# Patient Record
Sex: Male | Born: 1970 | Hispanic: Yes | Marital: Single | Smoking: Current every day smoker
Health system: Southern US, Community
[De-identification: ages and names within clinical notes are randomized; demographics above are authoritative.]

## PROBLEM LIST (undated history)

## (undated) DIAGNOSIS — J45909 Unspecified asthma, uncomplicated: Secondary | ICD-10-CM

---

## 2014-10-24 ENCOUNTER — Emergency Department: Payer: Self-pay

## 2014-10-24 ENCOUNTER — Encounter: Payer: Self-pay | Admitting: Family Medicine

## 2014-10-24 ENCOUNTER — Emergency Department
Admission: EM | Admit: 2014-10-24 | Discharge: 2014-10-24 | Disposition: A | Discharge status: Home / Self Care | Payer: Self-pay | Attending: Emergency Medicine | Admitting: Emergency Medicine

## 2014-10-24 DIAGNOSIS — Y998 Other external cause status: Secondary | ICD-10-CM | POA: Insufficient documentation

## 2014-10-24 DIAGNOSIS — Y9389 Activity, other specified: Secondary | ICD-10-CM | POA: Insufficient documentation

## 2014-10-24 DIAGNOSIS — Y9289 Other specified places as the place of occurrence of the external cause: Secondary | ICD-10-CM | POA: Insufficient documentation

## 2014-10-24 DIAGNOSIS — J4541 Moderate persistent asthma with (acute) exacerbation: Secondary | ICD-10-CM | POA: Insufficient documentation

## 2014-10-24 DIAGNOSIS — S86911A Strain of unspecified muscle(s) and tendon(s) at lower leg level, right leg, initial encounter: Secondary | ICD-10-CM | POA: Insufficient documentation

## 2014-10-24 DIAGNOSIS — S86912A Strain of unspecified muscle(s) and tendon(s) at lower leg level, left leg, initial encounter: Secondary | ICD-10-CM

## 2014-10-24 DIAGNOSIS — X58XXXA Exposure to other specified factors, initial encounter: Secondary | ICD-10-CM | POA: Insufficient documentation

## 2014-10-24 HISTORY — DX: Unspecified asthma, uncomplicated: J45.909

## 2014-10-24 MED ORDER — PREDNISONE 20 MG PO TABS
60.0000 mg | ORAL_TABLET | Freq: Once | ORAL | Status: AC
  Administered 2014-10-24: 60 mg via ORAL

## 2014-10-24 MED ORDER — PREDNISONE 10 MG PO TABS: 50.0000 mg | ORAL_TABLET | Freq: Every day | ORAL | Status: DC

## 2014-10-24 MED ORDER — AZITHROMYCIN 250 MG PO TABS: ORAL_TABLET | ORAL | Status: DC

## 2014-10-24 MED ORDER — NAPROXEN 500 MG PO TABS: 500.0000 mg | ORAL_TABLET | Freq: Two times a day (BID) | ORAL | Status: AC

## 2014-10-24 MED ORDER — AZITHROMYCIN 250 MG PO TABS
500.0000 mg | ORAL_TABLET | Freq: Once | ORAL | Status: AC
  Administered 2014-10-24: 500 mg via ORAL

## 2014-10-24 MED ORDER — IPRATROPIUM-ALBUTEROL 0.5-2.5 (3) MG/3ML IN SOLN
RESPIRATORY_TRACT | Status: AC
  Administered 2014-10-24: 3 mL via RESPIRATORY_TRACT
  Filled 2014-10-24: qty 3

## 2014-10-24 MED ORDER — ALBUTEROL SULFATE (2.5 MG/3ML) 0.083% IN NEBU
2.5000 mg | INHALATION_SOLUTION | Freq: Once | RESPIRATORY_TRACT | Status: AC
Start: 2014-10-24 — End: 2014-10-24
  Administered 2014-10-24: 2.5 mg via RESPIRATORY_TRACT

## 2014-10-24 MED ORDER — ALBUTEROL SULFATE HFA 108 (90 BASE) MCG/ACT IN AERS: 2.0000 | INHALATION_SPRAY | Freq: Four times a day (QID) | RESPIRATORY_TRACT | Status: DC | PRN

## 2014-10-24 MED ORDER — IPRATROPIUM-ALBUTEROL 0.5-2.5 (3) MG/3ML IN SOLN
3.0000 mL | Freq: Once | RESPIRATORY_TRACT | Status: AC
  Administered 2014-10-24: 3 mL via RESPIRATORY_TRACT

## 2014-10-24 MED ORDER — IBUPROFEN 800 MG PO TABS
ORAL_TABLET | ORAL | Status: AC
  Administered 2014-10-24: 800 mg via ORAL
  Filled 2014-10-24: qty 1

## 2014-10-24 MED ORDER — IBUPROFEN 800 MG PO TABS
800.0000 mg | ORAL_TABLET | Freq: Once | ORAL | Status: AC
  Administered 2014-10-24: 800 mg via ORAL

## 2014-10-24 MED ORDER — AZITHROMYCIN 250 MG PO TABS
ORAL_TABLET | ORAL | Status: AC
  Administered 2014-10-24: 500 mg via ORAL
  Filled 2014-10-24: qty 2

## 2014-10-24 MED ORDER — ALBUTEROL SULFATE (2.5 MG/3ML) 0.083% IN NEBU
INHALATION_SOLUTION | RESPIRATORY_TRACT | Status: AC
  Administered 2014-10-24: 2.5 mg via RESPIRATORY_TRACT
  Filled 2014-10-24: qty 3

## 2014-10-24 MED ORDER — PREDNISONE 20 MG PO TABS
ORAL_TABLET | ORAL | Status: AC
  Administered 2014-10-24: 60 mg via ORAL
  Filled 2014-10-24: qty 3

## 2014-10-24 NOTE — ED Notes (Signed)
Discharge instructions reviewed and explained process for getting free care and assistance with medications. Interpreter present.

## 2014-10-24 NOTE — ED Notes (Signed)
Patient reports on his way into the ed to have his right knee checked out because it hurts.  Pt with obvious respiratory distress noted.  Lungs with wheezes bilaterally.

## 2014-10-24 NOTE — Discharge Instructions (Signed)
Asma °(Asthma) °El asma es una enfermedad de los pulmones en la que las vías respiratorias se estrechan y obstruyen. El asma puede causar dificultad para respirar. El asma no puede curarse, pero los medicamentos y los cambios en el estilo de vida pueden ayudar a controlarla. Los siguientes factores pueden iniciar (desencadenar) el asma: °· Escamas de la piel de los animales (caspa). °· Polvo. °· Cucarachas. °· Polen. °· Moho. °· Humo. °· Productos de limpieza. °· Aerosoles para el cabello. °· Vapores de pintura u olores fuertes. °· Aire frío, cambios climáticos y vientos. °· Llanto o risa intensos. °· Estrés. °· Ciertos medicamentos o drogas. °· Alimentos, como frutas secas, papas fritas y vinos espumantes. °· Infecciones o afecciones (resfríos, gripe). °· Haga actividad física. °· Ciertas enfermedades o afecciones. °· Ejercicio o actividades extenuantes. °CUIDADOS EN EL HOGAR  °· Tome los medicamentos como le indicó el médico. °· Use un medidor de flujo espiratorio máximo como le indicó su médico. El medidor de flujo espiratorio máximo es una herramienta que mide el funcionamiento de los pulmones. °· Anote y lleve un registro de los valores del medidor de flujo espiratorio máximo. °· Conozca el plan de acción para el asma y úselo. El plan de acción para el asma es una planificación por escrito para el control y tratamiento de sus crisis asmáticas. °· Para prevenir las crisis asmáticas: °¨ No fume. Aléjese del humo de otros fumadores. °¨ Cambie el filtro de la calefacción y del aire acondicionado con frecuencia. °¨ Limite el uso de hogares o estufas a leña. °¨ Elimine las plagas (como cucarachas, ratones) y sus excrementos. °¨ Elimine las plantas si observa moho en ellas. °¨ Limpie los pisos. Elimine el polvo regularmente. Use productos de limpieza que sean inoloros. °¨ Pídale a alguien que pase la aspiradora cuando usted no se encuentre en casa. Utilice una aspiradora con filtros HEPA, siempre que le sea  posible. °¨ Reemplace las alfombras por pisos de madera, baldosas o vinilo. Las alfombras pueden retener las escamas de la piel de los animales y el polvo. °¨ Use almohadas, mantas y cubre colchones antialérgicos. °¨ Lave las sábanas y las mantas todas las semanas con agua caliente y séquelas con aire caliente. °¨ Use mantas de poliéster o algodón. °¨ Limpie baños y cocinas con lavandina. Si fuera posible, pídale a alguien que vuelva a pintar las paredes de estas habitaciones con una pintura resistente a los hongos. Aléjese de las habitaciones que se están limpiando y pintando. °¨ Lávese las manos con frecuencia. °SOLICITE AYUDA SI: °· Hace un silbido al respirar (sibilancias), tiene falta de aire o tiene tos incluso después de tomar los medicamentos para prevenir crisis. °· El moco coloreado que elimina (esputo) es más denso de lo normal. °· El moco coloreado que elimina cambia de trasparente o blanco a amarillo, verde, gris o sanguinolento. °· Tiene problemas causados por el medicamento que toma, por ejemplo: °¨ Una erupción. °¨ Picazón. °¨ Hinchazón. °¨ Problemas respiratorios. °· Necesita un medicamento de alivio más de 2 a 3 veces por semana. °· El flujo espiratorio máximo aún está en 50 a 79 % del mejor valor personal después de seguir el plan de acción durante 1 hora. °· Tiene fiebre. °SOLICITE AYUDA DE INMEDIATO SI:  °· Parece empeorar y no responde al medicamento durante una crisis asmática. °· Le falta el aire, incluso en reposo. °· Le falta el aire incluso cuando hace muy poca actividad física. °· Tiene dificultad para comer, beber o hablar. °· Siente dolor en el   pecho.  La frecuencia cardaca est acelerada.  Sus labios o uas comienzan a Environmental education officer.  Usted se siente mareado, dbil o se desmaya.  Su flujo mximo es Garment/textile technologist del 50% del Pharmacist, hospital personal. ASEGRESE DE QUE:   Comprende estas instrucciones.  Controlar su afeccin.  Recibir ayuda de inmediato si no mejora o si  empeora. Document Released: 07/06/2008 Document Revised: 08/24/2013 [facility] Patient Information 2015 [employer]. This information is not intended to replace advice given to you by your health care provider. Make sure you discuss any questions you have with your health care provider.

## 2014-10-24 NOTE — ED Provider Notes (Signed)
[facility] Emergency Department Provider Note  ____________________________________________  Time seen: Approximately 9:18 PM  I have reviewed the triage vital signs and the nursing notes.   HISTORY  Chief Complaint Asthma and Knee Pain    HPI Derrick Williamson is a 44 y.o. male who presents to the emergency department for sudden onset right knee pain. He denies any known injury. He states that he awoke this morning with the pain. During triage, the RN noted that he was having some difficulty breathing. He states that he has asthma and it has gotten worse over the past few days, but that is not the reason he is here. He states that he doesn't have any insurance and has not been doing any good to get asthma medicine because he can't afford it.   Past Medical History  Diagnosis Date  . Asthma     There are no active problems to display for this patient.   No past surgical history on file.  Current Outpatient Rx  Name  Route  Sig  Dispense  Refill  . albuterol (PROVENTIL HFA;VENTOLIN HFA) 108 (90 BASE) MCG/ACT inhaler   Inhalation   Inhale 2 puffs into the lungs every 6 (six) hours as needed for wheezing or shortness of breath.   1 Inhaler   2   . naproxen (NAPROSYN) 500 MG tablet   Oral   Take 1 tablet (500 mg total) by mouth 2 (two) times daily with a meal.   60 tablet   0   . predniSONE (DELTASONE) 10 MG tablet   Oral   Take 5 tablets (50 mg total) by mouth daily.   25 tablet   0     Allergies Review of patient's allergies indicates no known allergies.  No family history on file.  Social History History  Substance Use Topics  . Smoking status: Not on file  . Smokeless tobacco: Not on file  . Alcohol Use: Not on file    Review of Systems Constitutional: No fever/chills Eyes: No visual changes. ENT: No sore throat. Cardiovascular: Denies chest pain. Respiratory: Positive for shortness of breath. Gastrointestinal: No abdominal  pain.  No nausea, no vomiting.  No diarrhea.  No constipation. Genitourinary: Negative for dysuria. Musculoskeletal: Positive for right knee pain Skin: Negative for rash. Neurological: Negative for headaches, focal weakness or numbness.  10-point ROS otherwise negative.  ____________________________________________   PHYSICAL EXAM:  VITAL SIGNS: ED Triage Vitals  Enc Vitals Group     BP 10/24/14 1943 145/76 mmHg     Pulse Rate 10/24/14 1943 123     Resp 10/24/14 1943 32     Temp 10/24/14 1943 100.2 F (37.9 C)     Temp Source 10/24/14 1943 Oral     SpO2 10/24/14 1943 95 %     Weight 10/24/14 1943 128 lb (58.06 kg)     Height 10/24/14 1943  (1.702 m)     Head Cir --      Peak Flow --      Pain Score 10/24/14 1944 10     Pain Loc --      Pain Edu? --      Excl. in GC? --     Constitutional: Alert and oriented. Well appearing and in no acute distress. Eyes: Conjunctivae are normal. PERRL. EOMI. Head: Atraumatic. Nose: No congestion/rhinnorhea. Mouth/Throat: Mucous membranes are moist.  Oropharynx non-erythematous. Neck: No stridor.   Cardiovascular: Normal rate, regular rhythm. Grossly normal heart sounds.  Good peripheral  circulation. Respiratory: Normal respiratory effort.  No retractions. Expiratory wheezes in all fields. Gastrointestinal: Soft and nontender. No distention. No abdominal bruits. No CVA tenderness. Musculoskeletal: Right knee appears H Malik. There is no erythema or swelling. There is moderate tenderness in the medial and lateral joint line. Able to maintain straight leg raise without assistance. Neurologic:  Normal speech and language. No gross focal neurologic deficits are appreciated. Speech is normal. No gait instability. Skin:  Skin is warm, dry and intact. No rash noted. Psychiatric: Mood and affect are normal. Speech and behavior are normal.  ____________________________________________   LABS (all labs ordered are listed, but only abnormal  results are displayed)  Labs Reviewed - No data to display ____________________________________________  EKG   ____________________________________________  RADIOLOGY  No cardiopulmonary abnormalities. Small joint effusion with osseous densities above the patella. Images were viewed by me ____________________________________________   PROCEDURES  Procedure(s) performed: Knee immobilizer applied. NV intact post application.  Critical Care performed: No  ____________________________________________   INITIAL IMPRESSION / ASSESSMENT AND PLAN / ED COURSE  Pertinent labs & imaging results that were available during my care of the patient were reviewed by me and considered in my medical decision making (see chart for details).  Good air movement after a duo-neb and albuterol SVN. He continues to have moderate wheezing. He received Prednisone 60mg  tonight and will have a Rx for 4 additional days. He has also received Azithromycin 500mg  and will receive Rx.  He requests to be discharged.  [facility] as [facility]. He was strongly advised to fill the prescriptions and take them as prescribed. He was advised to return to the emergency department for any worsening of wheezing or feeling of shortness of breath. He was also advised to schedule an appointment with [facility] for management of his asthma and to further evaluate his knee pain.  ____________________________________________   FINAL CLINICAL IMPRESSION(S) / ED DIAGNOSES  Final diagnoses:  Acute asthma exacerbation, moderate persistent  Knee strain, left, initial encounter      Chinita PesterCari B Cartina Brousseau, FNP 10/24/14 2258  Darien Ramusavid W Kaminski, MD 10/25/14 Mikle [id number]

## 2014-12-01 ENCOUNTER — Emergency Department: Payer: Self-pay

## 2014-12-01 ENCOUNTER — Encounter: Payer: Self-pay | Admitting: *Deleted

## 2014-12-01 ENCOUNTER — Observation Stay
Admission: EM | Admit: 2014-12-01 | Discharge: 2014-12-04 | Disposition: A | Discharge status: Home / Self Care | Payer: Self-pay | Attending: Internal Medicine | Admitting: Internal Medicine

## 2014-12-01 DIAGNOSIS — R0602 Shortness of breath: Secondary | ICD-10-CM | POA: Insufficient documentation

## 2014-12-01 DIAGNOSIS — R200 Anesthesia of skin: Secondary | ICD-10-CM | POA: Insufficient documentation

## 2014-12-01 DIAGNOSIS — F17200 Nicotine dependence, unspecified, uncomplicated: Secondary | ICD-10-CM | POA: Insufficient documentation

## 2014-12-01 DIAGNOSIS — M4602 Spinal enthesopathy, cervical region: Secondary | ICD-10-CM | POA: Insufficient documentation

## 2014-12-01 DIAGNOSIS — M7989 Other specified soft tissue disorders: Secondary | ICD-10-CM | POA: Insufficient documentation

## 2014-12-01 DIAGNOSIS — F419 Anxiety disorder, unspecified: Secondary | ICD-10-CM | POA: Insufficient documentation

## 2014-12-01 DIAGNOSIS — J189 Pneumonia, unspecified organism: Secondary | ICD-10-CM

## 2014-12-01 DIAGNOSIS — R079 Chest pain, unspecified: Secondary | ICD-10-CM

## 2014-12-01 DIAGNOSIS — R531 Weakness: Secondary | ICD-10-CM

## 2014-12-01 DIAGNOSIS — J181 Lobar pneumonia, unspecified organism: Secondary | ICD-10-CM

## 2014-12-01 DIAGNOSIS — E0590 Thyrotoxicosis, unspecified without thyrotoxic crisis or storm: Secondary | ICD-10-CM

## 2014-12-01 DIAGNOSIS — J4520 Mild intermittent asthma, uncomplicated: Secondary | ICD-10-CM | POA: Insufficient documentation

## 2014-12-01 DIAGNOSIS — J441 Chronic obstructive pulmonary disease with (acute) exacerbation: Secondary | ICD-10-CM | POA: Insufficient documentation

## 2014-12-01 DIAGNOSIS — R05 Cough: Secondary | ICD-10-CM | POA: Insufficient documentation

## 2014-12-01 DIAGNOSIS — R9431 Abnormal electrocardiogram [ECG] [EKG]: Secondary | ICD-10-CM

## 2014-12-01 DIAGNOSIS — M25441 Effusion, right hand: Secondary | ICD-10-CM

## 2014-12-01 DIAGNOSIS — R202 Paresthesia of skin: Secondary | ICD-10-CM | POA: Insufficient documentation

## 2014-12-01 DIAGNOSIS — E871 Hypo-osmolality and hyponatremia: Secondary | ICD-10-CM | POA: Insufficient documentation

## 2014-12-01 DIAGNOSIS — J188 Other pneumonia, unspecified organism: Secondary | ICD-10-CM | POA: Insufficient documentation

## 2014-12-01 DIAGNOSIS — M47814 Spondylosis without myelopathy or radiculopathy, thoracic region: Secondary | ICD-10-CM | POA: Insufficient documentation

## 2014-12-01 DIAGNOSIS — R0789 Other chest pain: Principal | ICD-10-CM | POA: Insufficient documentation

## 2014-12-01 DIAGNOSIS — R000 Tachycardia, unspecified: Secondary | ICD-10-CM | POA: Insufficient documentation

## 2014-12-01 DIAGNOSIS — R634 Abnormal weight loss: Secondary | ICD-10-CM | POA: Insufficient documentation

## 2014-12-01 DIAGNOSIS — M542 Cervicalgia: Secondary | ICD-10-CM | POA: Insufficient documentation

## 2014-12-01 LAB — COMPREHENSIVE METABOLIC PANEL
ALBUMIN: 3.9 g/dL (ref 3.5–5.0)
ALT: 31 U/L (ref 17–63)
ANION GAP: 10 (ref 5–15)
AST: 25 U/L (ref 15–41)
Alkaline Phosphatase: 120 U/L (ref 38–126)
BILIRUBIN TOTAL: 0.6 mg/dL (ref 0.3–1.2)
BUN: 18 mg/dL (ref 6–20)
CO2: 26 mmol/L (ref 22–32)
Calcium: 9.1 mg/dL (ref 8.9–10.3)
Chloride: 94 mmol/L — ABNORMAL LOW (ref 101–111)
Creatinine, Ser: 0.59 mg/dL — ABNORMAL LOW (ref 0.61–1.24)
GFR calc Af Amer: >60 mL/min (ref >60)
GFR calc non Af Amer: >60 mL/min (ref >60)
Glucose, Bld: 132 mg/dL — ABNORMAL HIGH (ref 65–99)
POTASSIUM: 4.2 mmol/L (ref 3.5–5.1)
Sodium: 130 mmol/L — ABNORMAL LOW (ref 135–145)
Total Protein: 7.5 g/dL (ref 6.5–8.1)

## 2014-12-01 LAB — CBC WITH DIFFERENTIAL/PLATELET
BASOS ABS: 0 10*3/uL (ref 0–0.1)
BASOS PCT: 0 %
EOS PCT: 5 %
Eosinophils Absolute: 0.5 10*3/uL (ref 0–0.7)
HCT: 44.8 % (ref 40.0–52.0)
HEMOGLOBIN: 15 g/dL (ref 13.0–18.0)
LYMPHS PCT: 15 %
Lymphs Abs: 1.6 10*3/uL (ref 1.0–3.6)
MCH: 28.7 pg (ref 26.0–34.0)
MCHC: 33.4 g/dL (ref 32.0–36.0)
MCV: 86 fL (ref 80.0–100.0)
Monocytes Absolute: 1.3 10*3/uL — ABNORMAL HIGH (ref 0.2–1.0)
Monocytes Relative: 12 %
Neutro Abs: 7.2 10*3/uL — ABNORMAL HIGH (ref 1.4–6.5)
Neutrophils Relative %: 68 %
Platelets: 188 10*3/uL (ref 150–440)
RBC: 5.21 MIL/uL (ref 4.40–5.90)
RDW: 13.3 % (ref 11.5–14.5)
WBC: 10.7 10*3/uL — AB (ref 3.8–10.6)

## 2014-12-01 LAB — T4, FREE

## 2014-12-01 LAB — C-REACTIVE PROTEIN: CRP: 1.6 mg/dL — ABNORMAL HIGH (ref <1.0)

## 2014-12-01 LAB — TSH: TSH: 0.025 u[IU]/mL — AB (ref 0.350–4.500)

## 2014-12-01 LAB — TROPONIN I
Troponin I: <0.03 ng/mL (ref <0.031)
Troponin I: <0.03 ng/mL (ref <0.031)

## 2014-12-01 LAB — MAGNESIUM: Magnesium: 1.6 mg/dL — ABNORMAL LOW (ref 1.7–2.4)

## 2014-12-01 LAB — SEDIMENTATION RATE: Sed Rate: 9 mm/hr (ref 0–15)

## 2014-12-01 MED ORDER — SODIUM CHLORIDE 0.9 % IJ SOLN
3.0000 mL | Freq: Two times a day (BID) | INTRAMUSCULAR | Status: DC
  Administered 2014-12-02 – 2014-12-04 (×3): 3 mL via INTRAVENOUS

## 2014-12-01 MED ORDER — IPRATROPIUM-ALBUTEROL 0.5-2.5 (3) MG/3ML IN SOLN
3.0000 mL | RESPIRATORY_TRACT | Status: DC | PRN
  Administered 2014-12-02: 3 mL via RESPIRATORY_TRACT
  Filled 2014-12-01: qty 3

## 2014-12-01 MED ORDER — HYDROMORPHONE HCL 1 MG/ML IJ SOLN
1.0000 mg | INTRAMUSCULAR | Status: AC
  Administered 2014-12-01: 1 mg via INTRAVENOUS

## 2014-12-01 MED ORDER — ONDANSETRON HCL 4 MG/2ML IJ SOLN
4.0000 mg | INTRAMUSCULAR | Status: AC
  Administered 2014-12-01: 4 mg via INTRAVENOUS

## 2014-12-01 MED ORDER — ONDANSETRON HCL 4 MG PO TABS
4.0000 mg | ORAL_TABLET | Freq: Four times a day (QID) | ORAL | Status: DC | PRN
  Administered 2014-12-02 (×2): 4 mg via ORAL
  Filled 2014-12-01 (×2): qty 1

## 2014-12-01 MED ORDER — IOHEXOL 350 MG/ML SOLN
100.0000 mL | Freq: Once | INTRAVENOUS | Status: AC | PRN
  Administered 2014-12-01: 100 mL via INTRAVENOUS

## 2014-12-01 MED ORDER — SODIUM CHLORIDE 0.9 % IV SOLN
INTRAVENOUS | Status: DC
  Administered 2014-12-01: 100 mL/h via INTRAVENOUS
  Administered 2014-12-02 – 2014-12-03 (×4): via INTRAVENOUS

## 2014-12-01 MED ORDER — DEXTROSE 5 % IV SOLN
500.0000 mg | INTRAVENOUS | Status: DC
Start: 2014-12-02 — End: 2014-12-03
  Administered 2014-12-02: 500 mg via INTRAVENOUS
  Filled 2014-12-01 (×2): qty 500

## 2014-12-01 MED ORDER — HYDROMORPHONE HCL 1 MG/ML IJ SOLN
1.0000 mg | INTRAMUSCULAR | Status: AC
  Administered 2014-12-01: 1 mg via INTRAVENOUS
  Filled 2014-12-01: qty 1

## 2014-12-01 MED ORDER — ACETAMINOPHEN 650 MG RE SUPP: 650.0000 mg | Freq: Four times a day (QID) | RECTAL | Status: DC | PRN

## 2014-12-01 MED ORDER — ACETAMINOPHEN 325 MG PO TABS: 650.0000 mg | ORAL_TABLET | Freq: Four times a day (QID) | ORAL | Status: DC | PRN

## 2014-12-01 MED ORDER — DEXTROSE 5 % IV SOLN
500.0000 mg | Freq: Once | INTRAVENOUS | Status: AC
  Administered 2014-12-01: 500 mg via INTRAVENOUS
  Filled 2014-12-01 (×2): qty 500

## 2014-12-01 MED ORDER — HEPARIN SODIUM (PORCINE) 5000 UNIT/ML IJ SOLN
5000.0000 [IU] | Freq: Three times a day (TID) | INTRAMUSCULAR | Status: DC
  Administered 2014-12-01 – 2014-12-03 (×5): 5000 [IU] via SUBCUTANEOUS
  Filled 2014-12-01 (×6): qty 1

## 2014-12-01 MED ORDER — DEXTROSE 5 % IV SOLN
1.0000 g | INTRAVENOUS | Status: AC
  Administered 2014-12-01: 1 g via INTRAVENOUS
  Filled 2014-12-01 (×2): qty 10

## 2014-12-01 MED ORDER — ASPIRIN EC 81 MG PO TBEC
81.0000 mg | DELAYED_RELEASE_TABLET | Freq: Every day | ORAL | Status: DC
  Administered 2014-12-01 – 2014-12-04 (×4): 81 mg via ORAL
  Filled 2014-12-01 (×4): qty 1

## 2014-12-01 MED ORDER — DEXTROSE 5 % IV SOLN
1.0000 g | INTRAVENOUS | Status: DC
  Administered 2014-12-02 – 2014-12-03 (×2): 1 g via INTRAVENOUS
  Filled 2014-12-01 (×3): qty 10

## 2014-12-01 MED ORDER — MORPHINE SULFATE 2 MG/ML IJ SOLN
2.0000 mg | INTRAMUSCULAR | Status: DC | PRN
  Administered 2014-12-01 – 2014-12-02 (×5): 2 mg via INTRAVENOUS
  Filled 2014-12-01 (×5): qty 1

## 2014-12-01 MED ORDER — SODIUM CHLORIDE 0.9 % IV BOLUS (SEPSIS)
1000.0000 mL | INTRAVENOUS | Status: AC
  Administered 2014-12-01: 1000 mL via INTRAVENOUS

## 2014-12-01 MED ORDER — ONDANSETRON HCL 4 MG/2ML IJ SOLN: 4.0000 mg | Freq: Four times a day (QID) | INTRAMUSCULAR | Status: DC | PRN

## 2014-12-01 MED ORDER — KETOROLAC TROMETHAMINE 30 MG/ML IJ SOLN
30.0000 mg | Freq: Once | INTRAMUSCULAR | Status: AC
  Administered 2014-12-01: 30 mg via INTRAVENOUS
  Filled 2014-12-01: qty 1

## 2014-12-01 MED ORDER — OXYCODONE HCL 5 MG PO TABS
5.0000 mg | ORAL_TABLET | ORAL | Status: DC | PRN
  Administered 2014-12-02 (×2): 5 mg via ORAL
  Filled 2014-12-01 (×2): qty 1

## 2014-12-01 MED ORDER — ONDANSETRON HCL 4 MG/2ML IJ SOLN
INTRAMUSCULAR | Status: AC
  Administered 2014-12-01: 4 mg via INTRAVENOUS
  Filled 2014-12-01: qty 2

## 2014-12-01 MED ORDER — HYDROMORPHONE HCL 1 MG/ML IJ SOLN
INTRAMUSCULAR | Status: AC
  Administered 2014-12-01: 1 mg via INTRAVENOUS
  Filled 2014-12-01: qty 1

## 2014-12-01 NOTE — ED Provider Notes (Signed)
[facility] Emergency Department Provider Note  ____________________________________________  Time seen: Approximately 5:49 PM  I have reviewed the triage vital signs and the nursing notes.   HISTORY  Chief Complaint Clavicle Injury  The history is limited by the patient's agitation and somewhat limited by English being a second language  HPI Derrick Williamson is a 44 y.o. male with medical history of hyperthyroidism (untreated), asthma, and tobacco abuse who presents with acute onset of severe pain in his anterior chest radiating from the medial portion of his left clavicle.  The patient is extremely agitated, pacing back and forth, crying out in pain, and stating that he needs pain medicine.  This is limiting history.  He reports that it hurt a little bit when he awoke this morning, then he lied down and took a nap, and when he awoke he was in severe pain he denies any trauma.  He denies shortness of breath.  He states that the pain as going from that spot of the medial clavicle and radiating into the entire left side of his chest and through to his back.  He states that his heart hurts.  The pain is severe.  He denies nausea, vomiting, abdominal pain.  This is the extent of history that I can get from the patient due to his agitation, he refuses to cooperate anymore until he gets pain medicine.   Past Medical History  Diagnosis Date  . Asthma     There are no active problems to display for this patient.   History reviewed. No pertinent past surgical history.  Current Outpatient Rx  Name  Route  Sig  Dispense  Refill  . albuterol (PROVENTIL HFA;VENTOLIN HFA) 108 (90 BASE) MCG/ACT inhaler   Inhalation   Inhale 2 puffs into the lungs every 6 (six) hours as needed for wheezing or shortness of breath.   1 Inhaler   2   . azithromycin (ZITHROMAX Z-PAK) 250 MG tablet      Take 2 tablets (500 mg) on  Day 1,  followed by 1 tablet (250 mg) once daily on Days 2  through 5.   6 each   0   . naproxen (NAPROSYN) 500 MG tablet   Oral   Take 1 tablet (500 mg total) by mouth 2 (two) times daily with a meal.   60 tablet   0   . predniSONE (DELTASONE) 10 MG tablet   Oral   Take 5 tablets (50 mg total) by mouth daily.   25 tablet   0     Allergies Review of patient's allergies indicates no known allergies.  History reviewed. No pertinent family history.  Social History Social History  Substance Use Topics  . Smoking status: Current Every Day Smoker  . Smokeless tobacco: None  . Alcohol Use: None    Review of Systems Constitutional: No fever/chills Eyes: No visual changes. ENT: No sore throat. Cardiovascular: Left medial clavicular pain radiating throughout chest and into the back. Respiratory: Denies shortness of breath. Gastrointestinal: No abdominal pain.  No nausea, no vomiting.  No diarrhea.  No constipation. Genitourinary: Negative for dysuria. Musculoskeletal: Pain in back from the front of his chest Skin: Negative for rash. Neurological: Negative for headaches, focal weakness or numbness. Psychiatric:Extremely anxious 10-point ROS otherwise negative.  ____________________________________________   PHYSICAL EXAM:  ED Triage Vitals  Enc Vitals Group     BP 12/01/14 1817 149/94 mmHg     Pulse Rate 12/01/14 1817 110  Resp 12/01/14 1817 18     Temp --      Temp src --      SpO2 12/01/14 1817 96 %     Weight --      Height --      Head Cir --      Peak Flow --      Pain Score 12/01/14 1724 10     Pain Loc --      Pain Edu? --      Excl. in GC? --     Constitutional: Extremely thin male, Alert and oriented, but agitated with odd affect, pacing, crying out in pain, very anxious Eyes: Conjunctivae are normal. PERRL. EOMI. Head: Atraumatic. Nose: No congestion/rhinnorhea. Mouth/Throat: Mucous membranes are moist.  Oropharynx non-erythematous. Neck: No stridor.  The patient has a little bit of swelling on the  medial aspect of his clavicle with severe point tenderness to palpation.  The patient swats my hand away from him when I attempt to touch this area.  No goiter. Cardiovascular: Normal rate, regular rhythm. Grossly normal heart sounds.  Good peripheral circulation. Respiratory: Normal respiratory effort.  No retractions. Lungs CTAB. Gastrointestinal: Soft and nontender. No distention. No abdominal bruits. No CVA tenderness.  Very thin male Musculoskeletal: No lower extremity tenderness nor edema.  No joint effusions. Neurologic:  Normal speech and language. No gross focal neurologic deficits are appreciated.  Skin:  Skin is warm, dry and intact. No rash noted. Psychiatric: Anxious, somewhat bizarre behavior, minimally cooperative ____________________________________________   LABS (all labs ordered are listed, but only abnormal results are displayed)  Labs Reviewed  CBC WITH DIFFERENTIAL/PLATELET - Abnormal; Notable for the following:    WBC 10.7 (*)    Neutro Abs 7.2 (*)    Monocytes Absolute 1.3 (*)    All other components within normal limits  COMPREHENSIVE METABOLIC PANEL - Abnormal; Notable for the following:    Sodium 130 (*)    Chloride 94 (*)    Glucose, Bld 132 (*)    Creatinine, Ser 0.59 (*)    All other components within normal limits  T4, FREE - Abnormal; Notable for the following:    Free T4 >5.50 (*)    All other components within normal limits  TSH - Abnormal; Notable for the following:    TSH 0.025 (*)    All other components within normal limits  TROPONIN I  MAGNESIUM  SEDIMENTATION RATE  C-REACTIVE PROTEIN   ____________________________________________  EKG  ED ECG REPORT I, Taraoluwa Thakur, the attending physician, personally viewed and interpreted this ECG.   Date: 12/01/2014  EKG Time: 19:20  Rate: 103  Rhythm: sinus tachycardia  Axis: normal  Intervals:normal  ST&T Change: The patient has global ST segment elevation of approximately 1-2 mm with  slightly increased elevation in the septal/anterior leads consistent with early repolarization. ____________________________________________  RADIOLOGY  Dg Chest 2 View  12/01/2014   CLINICAL DATA:  Left-sided chest pain radiating into left upper extremity. Shortness of breath.  EXAM: CHEST  2 VIEW  COMPARISON:  October 24, 2014  FINDINGS: There is no edema or consolidation. Heart size and pulmonary vascularity are normal. No adenopathy. No pneumothorax. There is slight lower thoracic levoscoliosis.  IMPRESSION: No edema or consolidation.   Electronically Signed   By: Bretta Bang III M.D.   On: 12/01/2014 18:13   Ct Angio Chest Aorta W/cm &/or Wo/cm  12/01/2014   CLINICAL DATA:  Severe acute chest pain radiating to the back  EXAM:  CT ANGIOGRAPHY CHEST WITH CONTRAST  TECHNIQUE: Multidetector CT imaging of the chest was performed using the standard protocol during bolus administration of intravenous contrast. Multiplanar CT image reconstructions and MIPs were obtained to evaluate the vascular anatomy.  CONTRAST:  OMNIPAQUE IOHEXOL 350 MG/ML SOLN  COMPARISON:  None.  FINDINGS: There is adequate opacification of the pulmonary arteries. There is no pulmonary embolus. The main pulmonary artery, right main pulmonary artery and left main pulmonary arteries are normal in size. The heart size is normal. There is no pericardial effusion. The thoracic aorta is normal in caliber. There is no thoracic aortic dissection. There is no mediastinal hematoma.  There is right middle lobe hazy airspace disease most consistent with pneumonia.  There is no axillary, hilar, or mediastinal adenopathy.  There is no lytic or blastic osseous lesion.  The visualized portions of the upper abdomen are unremarkable.  Review of the MIP images confirms the above findings.  IMPRESSION: 1. No evidence of thoracic aortic dissection. 2. No evidence of pulmonary embolus. 3. Right middle lobe pneumonia.   Electronically Signed   By: Elige Ko   On: 12/01/2014 19:47    ____________________________________________   PROCEDURES  Procedure(s) performed: None  Critical Care performed: No ____________________________________________   INITIAL IMPRESSION / ASSESSMENT AND PLAN / ED COURSE  Pertinent labs & imaging results that were available during my care of the patient were reviewed by me and considered in my medical decision making (see chart for details).  Given the patient's agitation and pain, my first part is to establish a peripheral IV and I will address his pain with Dilaudid 1 mg to attempt to address his pain and calm him down.  We will send general blood work and, when he is more relaxed we will obtain a EKG.  My first imaging study will be a two-view chest.  At this time I do not believe he is likely to be suffering from a pneumothorax given no difficulty breathing, point tenderness on the clavicle, and no diminished breath sounds on the left.  I also doubt this represents ACS but I will check a troponin.  Given his history of untreated hyperthyroidism and is extremely thin body habitus, I will check a TSH and a free T4.  ----------------------------------------- 8:29 PM on 12/01/2014 -----------------------------------------  After milligram of Dilaudid, the patient is still pacing and describing how much he hurts.  He received another dose of Dilaudid 1 mg IV as well as 30 mg of Toradol IV, which should also help with possible pericarditis.  I obtained a CT angiogram of the chest to rule out aortic dissection given the description of the patient's pain.  He is also describing some numbness and his left arm, further concerning for the possibility of dissection.  However, the CT was unremarkable with no evidence of thoracic aortic dissection or pulmonary embolus.  Somewhat surprisingly, the patient appears to have a right middle lobe pneumonia.  No specific cause of his left sided radiating severe chest pain was  identified.  His EKG is abnormal but most consistent with pericarditis.  However, the patient's free T4 is unmeasurable high, he is having severe persistent chest pain in spite of multiple rounds of medication, he has an abnormal EKG, and a right middle lobe pneumonia.  I believe that further care in the hospital is appropriate for him for serial enzymes, pain control, and evaluation of his thyroid disease.  He is afebrile and there is no evidence of thyroid storm  at this time.  I will discuss the case with the hospitalist.  I am also treating as pneumonia with ceftriaxone and azithromycin (community-acquired pneumonia).  We drew blood cultures prior to antibiotics.  I also added on a sedimentation rate and C-reactive protein given the question of pericarditis and the less likely possibility of myocarditis.    ____________________________________________  FINAL CLINICAL IMPRESSION(S) / ED DIAGNOSES  Final diagnoses:  Left sided chest pain  Right middle lobe pneumonia  Hyperthyroidism  ST segment elevation      NEW MEDICATIONS STARTED DURING THIS VISIT:  New Prescriptions   No medications on file     Loleta Rose, MD 12/01/14 2138

## 2014-12-01 NOTE — ED Notes (Signed)
Patient transported to CT 

## 2014-12-01 NOTE — ED Notes (Signed)
Pt reports was sleeping and upon awakening began to have sharp pain to left clavicle. Pt in obvious discomfort. Movement makes it worse.

## 2014-12-01 NOTE — Progress Notes (Signed)
   12/01/14 2100  Clinical Encounter Type  Visited With Patient and family together  Visit Type Spiritual support  Spiritual Encounters  Spiritual Needs Grief support  Stress Factors  Patient Stress Factors Health changes  Family Stress Factors Health changes   Status: complaints of pain couldn't stay in bed, 44 yr male, spanish speaker Family: wife or girlfriend bedside, no children together, she said Visit Assessment: She said that she was Catholic but the patient is not affiliated. An offer for an interpreter and a priest was declined. Chaplain gave encouraging words before the family goes to [facility]. The lady is waiting for her sister to bring some food. She shared that she is a cancer survivor and has her hair back and the patient has Thyroid issues and Pneumonia, she says.  Pastoral Care: [phone] pager and available online by request

## 2014-12-01 NOTE — H&P (Signed)
[facility] - [place] at [facility]   PATIENT NAME: Derrick Williamson    MR#:  [id number]  DATE OF BIRTH:  Jul 03, 1970   DATE OF ADMISSION:  12/01/2014  PRIMARY CARE PHYSICIAN: No primary care provider on file.   REQUESTING/REFERRING PHYSICIAN: [place]  CHIEF COMPLAINT:   Chief Complaint  Patient presents with  . Clavicle Injury    HISTORY OF PRESENT ILLNESS:  Derrick Williamson  is a 44 y.o. male with a known history of intermittent asthma, untreated hypertension presenting with chest pain. He describes one day duration of chest pain. Left chest in location, radiation to the back, "pain" for quality, 10/10 intensity no real worsening or relieving factors. Denies any trauma or further injury. He does have an associated cough with some shortness of breath, also describes having subjective fever and chills. Denies any palpitations or further symptomatology at this time. Emergency room course noted abnormal EKG finding, abnormal thyroid function test, imaging suggestive of right lobe pneumonia.  PAST MEDICAL HISTORY:   Past Medical History  Diagnosis Date  . Asthma     PAST SURGICAL HISTORY:  History reviewed. No pertinent past surgical history.  SOCIAL HISTORY:   Social History  Substance Use Topics  . Smoking status: Current Every Day Smoker  . Smokeless tobacco: Not on file  . Alcohol Use: No    FAMILY HISTORY:  Denies history of cardiac disease DRUG ALLERGIES:  No Known Allergies  REVIEW OF SYSTEMS:  REVIEW OF SYSTEMS:  CONSTITUTIONAL: Positive fevers, chills, denies fatigue, weakness.  EYES: Denies blurred vision, double vision, or eye pain.  EARS, NOSE, THROAT: Denies tinnitus, ear pain, hearing loss.  RESPIRATORY: Positive cough, shortness of breath, denies wheezing  CARDIOVASCULAR: Positive chest pain, denies palpitations, edema.  GASTROINTESTINAL: Denies nausea, vomiting, diarrhea, abdominal pain.  GENITOURINARY: Denies dysuria,  hematuria.  ENDOCRINE: Denies nocturia or thyroid problems. HEMATOLOGIC AND LYMPHATIC: Denies easy bruising or bleeding.  SKIN: Denies rash or lesions.  MUSCULOSKELETAL: Denies pain in neck, back, shoulder, knees, hips, or further arthritic symptoms.  NEUROLOGIC: Denies paralysis, paresthesias.  PSYCHIATRIC: Denies anxiety or depressive symptoms. Otherwise full review of systems performed by me is negative.   MEDICATIONS AT HOME:   Prior to Admission medications   Medication Sig Start Date End Date Taking? Authorizing Provider  albuterol (PROVENTIL HFA;VENTOLIN HFA) 108 (90 BASE) MCG/ACT inhaler Inhale 2 puffs into the lungs every 6 (six) hours as needed for wheezing or shortness of breath. Patient not taking: Reported on 12/01/2014 10/24/14   Chinita Pester, FNP  azithromycin (ZITHROMAX Z-PAK) 250 MG tablet Take 2 tablets (500 mg) on  Day 1,  followed by 1 tablet (250 mg) once daily on Days 2 through 5. Patient not taking: Reported on 12/01/2014 10/24/14   Chinita Pester, FNP  naproxen (NAPROSYN) 500 MG tablet Take 1 tablet (500 mg total) by mouth 2 (two) times daily with a meal. Patient not taking: Reported on 12/01/2014 10/24/14 10/24/15  Chinita Pester, FNP  predniSONE (DELTASONE) 10 MG tablet Take 5 tablets (50 mg total) by mouth daily. Patient not taking: Reported on 12/01/2014 10/24/14   Chinita Pester, FNP      VITAL SIGNS:  Blood pressure 156/88, pulse 96, temperature 98.4 F (36.9 C), temperature source Oral, resp. rate 20, SpO2 98 %.  PHYSICAL EXAMINATION:  VITAL SIGNS: Filed Vitals:   12/01/14 2025  BP: 156/88  Pulse: 96  Temp: 98.4 F (36.9 C)  Resp: 20   GENERAL:44 y.o.male currently  in mild acute distress secondary to pain. Thin appearing HEAD: Normocephalic, atraumatic.  EYES: Pupils equal, round, reactive to light. Extraocular muscles intact. No scleral icterus.  MOUTH: Moist mucosal membrane. Dentition intact. No abscess noted.  EAR, NOSE, THROAT: Clear without  exudates. No external lesions.  NECK: Supple. No thyromegaly. No nodules. No JVD.  PULMONARY: Rhonchi over the right side, scant expiratory wheeze. No use of accessory muscles, Good respiratory effort. good air entry bilaterally CHEST: Tender to palpation over left chest.  CARDIOVASCULAR: S1 and S2. Regular rate and rhythm. No murmurs, rubs, or gallops. No edema. Pedal pulses 2+ bilaterally.  GASTROINTESTINAL: Soft, nontender, nondistended. No masses. Positive bowel sounds. No hepatosplenomegaly.  MUSCULOSKELETAL: No swelling, clubbing, or edema. Range of motion full in all extremities.  NEUROLOGIC: Cranial nerves II through XII are intact. No gross focal neurological deficits. Sensation intact. Reflexes intact. No tremors SKIN: No ulceration, lesions, rashes, or cyanosis. Skin warm and dry. Turgor intact.  PSYCHIATRIC: Mood, affect within normal limits. The patient is awake, alert and oriented x 3. Insight, judgment intact.    LABORATORY PANEL:   CBC  Recent Labs Lab 12/01/14 1759  WBC 10.7*  HGB 15.0  HCT 44.8  PLT 188   ------------------------------------------------------------------------------------------------------------------  Chemistries   Recent Labs Lab 12/01/14 1759 12/01/14 1803  NA 130*  --   K 4.2  --   CL 94*  --   CO2 26  --   GLUCOSE 132*  --   BUN 18  --   CREATININE 0.59*  --   CALCIUM 9.1  --   MG  --  1.6*  AST 25  --   ALT 31  --   ALKPHOS 120  --   BILITOT 0.6  --    ------------------------------------------------------------------------------------------------------------------  Cardiac Enzymes  Recent Labs Lab 12/01/14 1759  TROPONINI <0.03   ------------------------------------------------------------------------------------------------------------------  RADIOLOGY:  Dg Chest 2 View  12/01/2014   CLINICAL DATA:  Left-sided chest pain radiating into left upper extremity. Shortness of breath.  EXAM: CHEST  2 VIEW  COMPARISON:   October 24, 2014  FINDINGS: There is no edema or consolidation. Heart size and pulmonary vascularity are normal. No adenopathy. No pneumothorax. There is slight lower thoracic levoscoliosis.  IMPRESSION: No edema or consolidation.   Electronically Signed   By: Bretta Bang III M.D.   On: 12/01/2014 18:13   Ct Angio Chest Aorta W/cm &/or Wo/cm  12/01/2014   CLINICAL DATA:  Severe acute chest pain radiating to the back  EXAM: CT ANGIOGRAPHY CHEST WITH CONTRAST  TECHNIQUE: Multidetector CT imaging of the chest was performed using the standard protocol during bolus administration of intravenous contrast. Multiplanar CT image reconstructions and MIPs were obtained to evaluate the vascular anatomy.  CONTRAST:  OMNIPAQUE IOHEXOL 350 MG/ML SOLN  COMPARISON:  None.  FINDINGS: There is adequate opacification of the pulmonary arteries. There is no pulmonary embolus. The main pulmonary artery, right main pulmonary artery and left main pulmonary arteries are normal in size. The heart size is normal. There is no pericardial effusion. The thoracic aorta is normal in caliber. There is no thoracic aortic dissection. There is no mediastinal hematoma.  There is right middle lobe hazy airspace disease most consistent with pneumonia.  There is no axillary, hilar, or mediastinal adenopathy.  There is no lytic or blastic osseous lesion.  The visualized portions of the upper abdomen are unremarkable.  Review of the MIP images confirms the above findings.  IMPRESSION: 1. No evidence of thoracic  aortic dissection. 2. No evidence of pulmonary embolus. 3. Right middle lobe pneumonia.   Electronically Signed   By: Elige Ko   On: 12/01/2014 19:47    EKG:   Orders placed or performed during the hospital encounter of 12/01/14  . ED EKG  . ED EKG    IMPRESSION AND PLAN:   44 year old Hispanic gentleman history of intermittent asthma, untreated hyperthyroidism presented with chest pain.  1. Chest pain, left-sided: Place  on telemetry, trend cardiac enzymes, initiate aspirin, check a CRP-given EKG findings with inferior PR depression suspect this could be related to pericarditis however there are no friction rubs on exam. If CRP is markedly elevated will initiate treatment with NSAIDs for presumptive pericarditis  2. Community acquired pneumonia: Antibiotics coverage with ceftriaxone and azithromycin, oxygen as required, DuoNeb treatments as required, follow culture data 3. Hyperthyroidism, untreated: No evidence of hyperthyroid storm, afebrile, with normal vital signs including heart rate and blood pressure, no signs of peripheral edema, GI symptoms regardless consult endocrinology for further management. 4. Hyponatremia: IV fluid hydration and follow sodium level V. Venous thromboembolism prophylactic: Heparin subcutaneous    All the records are reviewed and case discussed with ED provider. Management plans discussed with the patient, family and they are in agreement.  CODE STATUS: Full  TOTAL TIME TAKING CARE OF THIS PATIENT: 45 minutes.    Dnaiel Voller,  Mardi Mainland.D on 12/01/2014 at 9:08 PM  Between 7am to 6pm - Pager - [phone]  After 6pm: House Pager: - [phone]  [facility]  Office  [phone]  CC: Primary care physician; No primary care provider on file.

## 2014-12-02 ENCOUNTER — Observation Stay: Payer: Self-pay

## 2014-12-02 LAB — TROPONIN I: Troponin I: 0.03 ng/mL (ref <0.031)

## 2014-12-02 LAB — CK: CK TOTAL: 79 U/L (ref 49–397)

## 2014-12-02 LAB — C-REACTIVE PROTEIN: CRP: 1.5 mg/dL — AB (ref <1.0)

## 2014-12-02 MED ORDER — IPRATROPIUM-ALBUTEROL 0.5-2.5 (3) MG/3ML IN SOLN
3.0000 mL | Freq: Four times a day (QID) | RESPIRATORY_TRACT | Status: DC
  Administered 2014-12-02 – 2014-12-03 (×6): 3 mL via RESPIRATORY_TRACT
  Filled 2014-12-02 (×8): qty 3

## 2014-12-02 MED ORDER — MORPHINE SULFATE 2 MG/ML IJ SOLN
1.0000 mg | Freq: Once | INTRAMUSCULAR | Status: AC
  Administered 2014-12-02: 1 mg via INTRAVENOUS
  Filled 2014-12-02: qty 1

## 2014-12-02 MED ORDER — GABAPENTIN 100 MG PO CAPS
100.0000 mg | ORAL_CAPSULE | Freq: Two times a day (BID) | ORAL | Status: DC
  Administered 2014-12-02 – 2014-12-04 (×5): 100 mg via ORAL
  Filled 2014-12-02 (×5): qty 1

## 2014-12-02 MED ORDER — NICOTINE 14 MG/24HR TD PT24
14.0000 mg | MEDICATED_PATCH | Freq: Every day | TRANSDERMAL | Status: DC
  Filled 2014-12-02 (×2): qty 1

## 2014-12-02 MED ORDER — KETOROLAC TROMETHAMINE 15 MG/ML IJ SOLN
15.0000 mg | Freq: Four times a day (QID) | INTRAMUSCULAR | Status: DC | PRN
  Administered 2014-12-02: 15 mg via INTRAVENOUS
  Filled 2014-12-02 (×2): qty 1

## 2014-12-02 MED ORDER — OXYCODONE-ACETAMINOPHEN 5-325 MG PO TABS
1.0000 | ORAL_TABLET | Freq: Four times a day (QID) | ORAL | Status: DC | PRN
  Administered 2014-12-02 – 2014-12-03 (×3): 1 via ORAL
  Filled 2014-12-02 (×3): qty 1

## 2014-12-02 MED ORDER — METHYLPREDNISOLONE SODIUM SUCC 125 MG IJ SOLR
125.0000 mg | Freq: Once | INTRAMUSCULAR | Status: AC
  Administered 2014-12-02: 125 mg via INTRAVENOUS
  Filled 2014-12-02: qty 2

## 2014-12-02 MED ORDER — FAMOTIDINE 20 MG PO TABS
20.0000 mg | ORAL_TABLET | Freq: Two times a day (BID) | ORAL | Status: DC
  Administered 2014-12-02 – 2014-12-04 (×5): 20 mg via ORAL
  Filled 2014-12-02 (×5): qty 1

## 2014-12-02 MED ORDER — METHYLPREDNISOLONE SODIUM SUCC 125 MG IJ SOLR
60.0000 mg | Freq: Three times a day (TID) | INTRAMUSCULAR | Status: DC
  Administered 2014-12-02 – 2014-12-03 (×3): 60 mg via INTRAVENOUS
  Filled 2014-12-02 (×3): qty 2

## 2014-12-02 MED ORDER — DOCUSATE SODIUM 100 MG PO CAPS
100.0000 mg | ORAL_CAPSULE | Freq: Two times a day (BID) | ORAL | Status: DC
  Administered 2014-12-02 – 2014-12-04 (×4): 100 mg via ORAL
  Filled 2014-12-02 (×5): qty 1

## 2014-12-02 MED ORDER — ALBUTEROL SULFATE (2.5 MG/3ML) 0.083% IN NEBU: 2.5000 mg | INHALATION_SOLUTION | RESPIRATORY_TRACT | Status: DC | PRN

## 2014-12-02 NOTE — Consult Note (Signed)
Left painful sternoclavicular pain, tender.  Appears to be anterior sternoclavicular subluxation.  Should not need treatment.  Could be septic joint, but now warmth of fluctuance.  Will follow while in hospital.

## 2014-12-02 NOTE — Progress Notes (Signed)
Initial Nutrition Assessment       INTERVENTION:  Meals and snacks: Cater to pt preferences Coordination of care: Discussed pt in pain with RN, Tacey Ruiz and aware.   NUTRITION DIAGNOSIS:   Unintentional weight loss related to  (unknown cause) as evidenced by percent weight loss.    GOAL:   Patient will meet greater than or equal to 90% of their needs    MONITOR:    (Energy intake)  REASON FOR ASSESSMENT:   Malnutrition Screening Tool    ASSESSMENT:      Pt admitted with chest pain, clavicle pain (ortho following), left upper extremity and lower extremity tingling, elevated blood pressure  Past Medical History  Diagnosis Date  . Asthma     Current Nutrition: eating 100% of meals during admission  Food/Nutrition-Related History: pt reports good po intake prior to admission, small decrease in intake for 4 days prior to admission but still eating   Medications: NS at 142ml/hr, colace, solumedrol  Electrolyte/Renal Profile and Glucose Profile:   Recent Labs Lab 12/01/14 1759 12/01/14 1803  NA 130*  --   K 4.2  --   CL 94*  --   CO2 26  --   BUN 18  --   CREATININE 0.59*  --   CALCIUM 9.1  --   MG  --  1.6*  GLUCOSE 132*  --    Protein Profile:   Recent Labs Lab 12/01/14 1759  ALBUMIN 3.9     Last BM: unknown   Nutrition-Focused Physical Exam Findings:  Unable to complete Nutrition-Focused physical exam at this time.  Pt complaining of pain during visit.   Weight Change: Pt reports 70 pound weight loss in the last year (35% weight loss in 1 year)    Diet Order:  Diet Heart Room service appropriate?: Yes; Fluid consistency:: Thin  Skin:   reviewed      Height:   Ht Readings from Last 1 Encounters:  12/02/14  (1.702 m)    Weight:   Wt Readings from Last 1 Encounters:  12/01/14 126 lb 8 oz (57.38 kg)     BMI:  Body mass index is 19.81 kg/(m^2).  Estimated Nutritional Needs:   Kcal:  BEE 1428 kcals (IF 1.0-1.2, AF  1.3) 1610-9604 kcals/d  Protein:  (1.0-1.2 g/kg) 57-68 g/d  Fluid:  (25-49ml/kg) 1425-1716ml/d  EDUCATION NEEDS:   No education needs identified at this time  MODERATE Care Level  Garison Genova B. Freida Busman, RD, LDN [phone] (pager)

## 2014-12-02 NOTE — Progress Notes (Signed)
Alert and oriented. Sinus rhythm on tele. IV fluids and antibiotics continued, positive pneumonia on chest CT. Patient has complained of 8/10-10/10 pain in his left clavicle area all day, given multiple pain medications with some relief. Patient has slept some this afternoon and is able to move his arm more now that the pain has decreased some. Orthopedics has been by and patient has been educated with a spanish document about his clavicle. Endocrinology and neurology will see the patient tomorrow. Will continue to monitor.

## 2014-12-02 NOTE — Progress Notes (Signed)
[facility] - [place] at [facility]   PATIENT NAME: Derrick Williamson    MR#:  [id number]  DATE OF BIRTH:  February 08, 1971  SUBJECTIVE:  CHIEF COMPLAINT:  Patient is reporting left upper extremity and lower extremity tingling and numbness associated with pain. He is worried about his elevated blood pressure. He is also reporting left clavicular pain with some swelling. Denies any trauma or injury. Admits smoking approximately half pack a day  REVIEW OF SYSTEMS:  CONSTITUTIONAL: No fever, fatigue or weakness.  EYES: No blurred or double vision.  EARS, NOSE, AND THROAT: No tinnitus or ear pain.  RESPIRATORY: No cough, shortness of breath, wheezing or hemoptysis.  CARDIOVASCULAR: No chest pain, orthopnea, edema.  GASTROINTESTINAL: No nausea, vomiting, diarrhea or abdominal pain.  GENITOURINARY: No dysuria, hematuria.  ENDOCRINE: No polyuria, nocturia,  HEMATOLOGY: No anemia, easy bruising or bleeding SKIN: No rash or lesion. MUSCULOSKELETAL: No joint pain or arthritis.  Reporting left clavicular pain NEUROLOGIC: Reporting tingling, numbness and weakness on left upper extremity and lower extremity. Denies any difficulty with swallowing PSYCHIATRY: No anxiety or depression.   DRUG ALLERGIES:  No Known Allergies  VITALS:  Blood pressure 135/66, pulse 105, temperature 99.5 F (37.5 C), temperature source Oral, resp. rate 24, height 5\' 7"  (1.702 m), weight 57.38 kg (126 lb 8 oz), SpO2 92 %.  PHYSICAL EXAMINATION:  GENERAL:  44 y.o.-year-old patient lying in the bed with no acute distress.  EYES: Pupils equal, round, reactive to light and accommodation. No scleral icterus. Extraocular muscles intact.  HEENT: Head atraumatic, normocephalic. Oropharynx and nasopharynx clear.  NECK:  Supple, no jugular venous distention. No thyroid enlargement, no tenderness.  LUNGS: Normal breath sounds bilaterally, no wheezing, rales,rhonchi or crepitation. No use of accessory muscles of  respiration.  CARDIOVASCULAR: S1, S2 normal. No murmurs, rubs, or gallops.  ABDOMEN: Soft, nontender, nondistended. Bowel sounds present. No organomegaly or mass.  EXTREMITIES: No pedal edema, cyanosis, or clubbing.  NEUROLOGIC: Cranial nerves II through XII are intact. Muscle strength 4/5 in left upper and lower extremity and 5 out of 5 in right extremities. Sensation intact. Gait not checked. Reflexes are 2+. No deviation of the angle of the mouth PSYCHIATRIC: The patient is alert and oriented x 3.  SKIN: No obvious rash, lesion, or ulcer.    LABORATORY PANEL:   CBC  Recent Labs Lab 12/01/14 1759  WBC 10.7*  HGB 15.0  HCT 44.8  PLT 188   ------------------------------------------------------------------------------------------------------------------  Chemistries   Recent Labs Lab 12/01/14 1759 12/01/14 1803  NA 130*  --   K 4.2  --   CL 94*  --   CO2 26  --   GLUCOSE 132*  --   BUN 18  --   CREATININE 0.59*  --   CALCIUM 9.1  --   MG  --  1.6*  AST 25  --   ALT 31  --   ALKPHOS 120  --   BILITOT 0.6  --    ------------------------------------------------------------------------------------------------------------------  Cardiac Enzymes  Recent Labs Lab 12/02/14 1043  TROPONINI 0.03   ------------------------------------------------------------------------------------------------------------------  RADIOLOGY:  Dg Chest 2 View  12/01/2014   CLINICAL DATA:  Left-sided chest pain radiating into left upper extremity. Shortness of breath.  EXAM: CHEST  2 VIEW  COMPARISON:  October 24, 2014  FINDINGS: There is no edema or consolidation. Heart size and pulmonary vascularity are normal. No adenopathy. No pneumothorax. There is slight lower thoracic levoscoliosis.  IMPRESSION: No edema or consolidation.  Electronically Signed   By: Bretta Bang III M.D.   On: 12/01/2014 18:13   Ct Angio Chest Aorta W/cm &/or Wo/cm  12/01/2014   CLINICAL DATA:  Severe acute chest  pain radiating to the back  EXAM: CT ANGIOGRAPHY CHEST WITH CONTRAST  TECHNIQUE: Multidetector CT imaging of the chest was performed using the standard protocol during bolus administration of intravenous contrast. Multiplanar CT image reconstructions and MIPs were obtained to evaluate the vascular anatomy.  CONTRAST:  OMNIPAQUE IOHEXOL 350 MG/ML SOLN  COMPARISON:  None.  FINDINGS: There is adequate opacification of the pulmonary arteries. There is no pulmonary embolus. The main pulmonary artery, right main pulmonary artery and left main pulmonary arteries are normal in size. The heart size is normal. There is no pericardial effusion. The thoracic aorta is normal in caliber. There is no thoracic aortic dissection. There is no mediastinal hematoma.  There is right middle lobe hazy airspace disease most consistent with pneumonia.  There is no axillary, hilar, or mediastinal adenopathy.  There is no lytic or blastic osseous lesion.  The visualized portions of the upper abdomen are unremarkable.  Review of the MIP images confirms the above findings.  IMPRESSION: 1. No evidence of thoracic aortic dissection. 2. No evidence of pulmonary embolus. 3. Right middle lobe pneumonia.   Electronically Signed   By: Elige Ko   On: 12/01/2014 19:47    EKG:   Orders placed or performed during the hospital encounter of 12/01/14  . ED EKG  . ED EKG  . EKG 12-Lead  . EKG 12-Lead    ASSESSMENT AND PLAN:   44 year old Hispanic gentleman history of intermittent asthma, untreated hyperthyroidism presented with chest pain.  #. Left upper extremity and lower extremity weakness with tingling and numbness-rule out TIA versus CVA, differential diagnosis cervical radiculopathy and sciatica Will get CT head stat Patient will be on baby aspirin and statin. Check fasting lipid panel Will get bedside swallow evaluation Neurology consult and PT consult is placed Will also obtain CT of the cervical and thoracic  spine  #Left clavicular pain Denies any trauma or injury Will provide him pain management as needed Orthopedics consult is placed PT consult is placed for evaluation and treatment  #. Chest pain, left-sided: Can be pleuritic chest pain Ruled out acute coronary syndrome with negative cardiac enzyme trend  Continue monitoring the patient on telemetry Continue aspirin and statin CPR is not elevated, 1 dose of Toradol was given which was not helpful to the patient's pain, I doubt patient has acute pericarditis, no pericardial rub  # Community acquired pneumonia: Antibiotics coverage with ceftriaxone and azithromycin, oxygen as required, DuoNeb treatments as required, follow culture data  #Acute exacerbation of COPD Will provide salmeterol 120 mg IV and continues on a Medrol at 60 mg IV every 6 hours  DuoNeb nebulizer treatments every 6 hours Albuterol neb liters every 4 hours as needed basis  #. Hyperthyroidism, untreated: No evidence of hyperthyroid storm, afebrile, with normal vital signs including heart rate and blood pressure, no signs of peripheral edema, consult endocrinology for further management. Check T3 level  #. Hyponatremia: IV fluid hydration and follow sodium level  #. Venous thromboembolism prophylactic: Heparin subcutaneous  # Tobacco abuse disorder-counseled patient to quit smoking for 3-5 minutes. We will provide nicotine patch, patient is agreeable for nicotine patch     All the records are reviewed and case discussed with Care Management/Social Workerr. Management plans discussed with the patient, family and they are  in agreement.  CODE STATUS: Full code  TOTAL TIME TAKING CARE OF THIS PATIENT with the help of Spanish interpreter Mr. Maryjane Hurter:  40 minutes.   POSSIBLE D/C IN 3 DAYS, DEPENDING ON CLINICAL CONDITION.   [name] M.D on 12/02/2014 at 11:39 AM  Between 7am to 6pm - Pager - [phone] After 6pm go to [email or web address] - password [email or web address] [facility]  [place]  [place] [facility]  Office  [phone]  CC: Primary care physician; No primary care provider on file. the

## 2014-12-02 NOTE — Progress Notes (Signed)
Patient was admitted from the ED d/t c/o left chest pain around the clavicle. On admission to the unit patient stated that he only have the pain with activity. One time dose of 2 mg of PRN morphine was administered for pain of 8/10. Patient is a spanish speaker but refused interpreter, the admission documentation was completed with his girlfriend who speaks english. Patient remained NSR/ST on the monitor with stable VS. Girlfriend stayed with patient overnight.  0335: Patient woke from sleep with a complain of 9/10 pain on his left clavicle and chest and around his neck.  IV morphine was given for breakthrough pain and  of oxycodone. On reassessment patient stated that "the pain not going away" Dr. Sheryle Hail was contacted and another  of IV morphine was administered.Will continue to monitor

## 2014-12-03 DIAGNOSIS — R079 Chest pain, unspecified: Secondary | ICD-10-CM

## 2014-12-03 LAB — LIPID PANEL
CHOL/HDL RATIO: 2.7 ratio
Cholesterol: 93 mg/dL (ref 0–200)
HDL: 35 mg/dL — ABNORMAL LOW (ref >40)
LDL Cholesterol: 52 mg/dL (ref 0–99)
TRIGLYCERIDES: 28 mg/dL (ref <150)
VLDL: 6 mg/dL (ref 0–40)

## 2014-12-03 LAB — CBC
HCT: 36.8 % — ABNORMAL LOW (ref 40.0–52.0)
Hemoglobin: 12.3 g/dL — ABNORMAL LOW (ref 13.0–18.0)
MCH: 29.2 pg (ref 26.0–34.0)
MCHC: 33.6 g/dL (ref 32.0–36.0)
MCV: 87.1 fL (ref 80.0–100.0)
PLATELETS: 162 10*3/uL (ref 150–440)
RBC: 4.22 MIL/uL — AB (ref 4.40–5.90)
RDW: 12.6 % (ref 11.5–14.5)
WBC: 8.1 10*3/uL (ref 3.8–10.6)

## 2014-12-03 LAB — BASIC METABOLIC PANEL
Anion gap: 7 (ref 5–15)
BUN: 15 mg/dL (ref 6–20)
CALCIUM: 9.1 mg/dL (ref 8.9–10.3)
CO2: 27 mmol/L (ref 22–32)
Chloride: 103 mmol/L (ref 101–111)
Creatinine, Ser: 0.55 mg/dL — ABNORMAL LOW (ref 0.61–1.24)
GFR calc Af Amer: >60 mL/min (ref >60)
GFR calc non Af Amer: >60 mL/min (ref >60)
GLUCOSE: 193 mg/dL — AB (ref 65–99)
Potassium: 4 mmol/L (ref 3.5–5.1)
SODIUM: 137 mmol/L (ref 135–145)

## 2014-12-03 MED ORDER — METHIMAZOLE 10 MG PO TABS
20.0000 mg | ORAL_TABLET | Freq: Every day | ORAL | Status: DC
  Administered 2014-12-03 – 2014-12-04 (×2): 20 mg via ORAL
  Filled 2014-12-03 (×2): qty 2

## 2014-12-03 MED ORDER — PROPRANOLOL HCL 40 MG PO TABS
80.0000 mg | ORAL_TABLET | Freq: Two times a day (BID) | ORAL | Status: DC
  Administered 2014-12-03 – 2014-12-04 (×3): 80 mg via ORAL
  Filled 2014-12-03 (×2): qty 1
  Filled 2014-12-03 (×2): qty 2

## 2014-12-03 MED ORDER — METHYLPREDNISOLONE SODIUM SUCC 40 MG IJ SOLR
40.0000 mg | Freq: Two times a day (BID) | INTRAMUSCULAR | Status: DC
  Administered 2014-12-03 – 2014-12-04 (×2): 40 mg via INTRAVENOUS
  Filled 2014-12-03 (×2): qty 1

## 2014-12-03 MED ORDER — AZITHROMYCIN 250 MG PO TABS
500.0000 mg | ORAL_TABLET | Freq: Every day | ORAL | Status: DC
  Administered 2014-12-03: 500 mg via ORAL
  Filled 2014-12-03: qty 2

## 2014-12-03 MED ORDER — ZOLPIDEM TARTRATE 5 MG PO TABS
5.0000 mg | ORAL_TABLET | Freq: Every evening | ORAL | Status: DC | PRN
  Administered 2014-12-03: 5 mg via ORAL
  Filled 2014-12-03 (×2): qty 1

## 2014-12-03 NOTE — Consult Note (Signed)
CC: L sided weakness   HPI: Derrick Williamson is an 44 y.o. male with a known history of intermittent asthma, untreated hypertension presenting with chest pain.He describes one day duration of chest pain. Left chest in location, radiation to the back, "pain" for quality, 10/10 intensity no real worsening or relieving factors. Current pain in sternoclavicular. Weakness has improved, pt is ambulating w/out assistance.   Past Medical History  Diagnosis Date  . Asthma     History reviewed. No pertinent past surgical history.  Family History  Problem Relation Age of Onset  . Heart failure Neg Hx     Social History:  reports that he has been smoking.  He does not have any smokeless tobacco history on file. He reports that he does not drink alcohol or use illicit drugs.  No Known Allergies  Medications: I have reviewed the patient's current medications.  ROS: History obtained from the patient  General ROS: negative for - chills, fatigue, fever, night sweats, weight gain or weight loss Psychological ROS: negative for - behavioral disorder, hallucinations, memory difficulties, mood swings or suicidal ideation Ophthalmic ROS: negative for - blurry vision, double vision, eye pain or loss of vision ENT ROS: negative for - epistaxis, nasal discharge, oral lesions, sore throat, tinnitus or vertigo Allergy and Immunology ROS: negative for - hives or itchy/watery eyes Hematological and Lymphatic ROS: negative for - bleeding problems, bruising or swollen lymph nodes Endocrine ROS: negative for - galactorrhea, hair pattern changes, polydipsia/polyuria or temperature intolerance Respiratory ROS: negative for - cough, hemoptysis, shortness of breath or wheezing Cardiovascular ROS: negative for - chest pain, dyspnea on exertion, edema or irregular heartbeat Gastrointestinal ROS: negative for - abdominal pain, diarrhea, hematemesis, nausea/vomiting or stool incontinence Genito-Urinary ROS: negative for -  dysuria, hematuria, incontinence or urinary frequency/urgency Musculoskeletal ROS: negative for - joint swelling or muscular weakness Neurological ROS: as noted in HPI Dermatological ROS: negative for rash and skin lesion changes  Physical Examination: Blood pressure 142/52, pulse 110, temperature 98.6 F (37 C), temperature source Oral, resp. rate 16, height 5\' 7"  (1.702 m), weight 57.38 kg (126 lb 8 oz), SpO2 93 %.  Neurological Examination Mental Status: Alert, oriented, thought content appropriate.  Speech fluent without evidence of aphasia.  Able to follow 3 step commands without difficulty. Cranial Nerves: II: Discs flat bilaterally; Visual fields grossly normal, pupils equal, round, reactive to light and accommodation III,IV, VI: ptosis not present, extra-ocular motions intact bilaterally V,VII: smile symmetric, facial light touch sensation normal bilaterally VIII: hearing normal bilaterally IX,X: gag reflex present XI: bilateral shoulder shrug XII: midline tongue extension Motor: Right : Upper extremity   5/5    Left:     Upper extremity   5/5  Lower extremity   5/5     Lower extremity   5/5 Tone and bulk:normal tone throughout; no atrophy noted Sensory: Pinprick and light touch intact throughout, bilaterally Deep Tendon Reflexes: 2+ and symmetric throughout Plantars: Right: downgoing   Left: downgoing Cerebellar: normal finger-to-nose, normal rapid alternating movements and normal heel-to-shin test Gait: normal gait and station      Laboratory Studies:   Basic Metabolic Panel:  Recent Labs Lab 12/01/14 1759 12/01/14 1803 12/03/14 0552  NA 130*  --  137  K 4.2  --  4.0  CL 94*  --  103  CO2 26  --  27  GLUCOSE 132*  --  193*  BUN 18  --  15  CREATININE 0.59*  --  0.55*  CALCIUM 9.1  --  9.1  MG  --  1.6*  --     Liver Function Tests:  Recent Labs Lab 12/01/14 1759  AST 25  ALT 31  ALKPHOS 120  BILITOT 0.6  PROT 7.5  ALBUMIN 3.9   No results  for input(s): LIPASE, AMYLASE in the last 168 hours. No results for input(s): AMMONIA in the last 168 hours.  CBC:  Recent Labs Lab 12/01/14 1759 12/03/14 0552  WBC 10.7* 8.1  NEUTROABS 7.2*  --   HGB 15.0 12.3*  HCT 44.8 36.8*  MCV 86.0 87.1  PLT 188 162    Cardiac Enzymes:  Recent Labs Lab 12/01/14 1759 12/01/14 2303 12/02/14 0456 12/02/14 1043  CKTOTAL  --   --   --  79  TROPONINI <0.03 <0.03 <0.03 0.03    BNP: Invalid input(s): POCBNP  CBG: No results for input(s): GLUCAP in the last 168 hours.  Microbiology: Results for orders placed or performed during the hospital encounter of 12/01/14  Blood culture (routine x 2)     Status: None (Preliminary result)   Collection Time: 12/01/14  9:05 PM  Result Value Ref Range Status   Specimen Description BLOOD BLOOD RIGHT FOREARM  Final   Special Requests BOTTLES DRAWN AEROBIC AND ANAEROBIC  Final   Culture NO GROWTH 2 DAYS  Final   Report Status PENDING  Incomplete  Blood culture (routine x 2)     Status: None (Preliminary result)   Collection Time: 12/01/14  9:06 PM  Result Value Ref Range Status   Specimen Description BLOOD LEFT ASSIST CONTROL  Final   Special Requests BOTTLES DRAWN AEROBIC AND ANAEROBIC  Final   Culture NO GROWTH 2 DAYS  Final   Report Status PENDING  Incomplete    Coagulation Studies: No results for input(s): LABPROT, INR in the last 72 hours.  Urinalysis: No results for input(s): COLORURINE, LABSPEC, PHURINE, GLUCOSEU, HGBUR, BILIRUBINUR, KETONESUR, PROTEINUR, UROBILINOGEN, NITRITE, LEUKOCYTESUR in the last 168 hours.  Invalid input(s): APPERANCEUR  Lipid Panel:     Component Value Date/Time   CHOL 93 12/03/2014 0552   TRIG 28 12/03/2014 0552   HDL 35* 12/03/2014 0552   CHOLHDL 2.7 12/03/2014 0552   VLDL 6 12/03/2014 0552   LDLCALC 52 12/03/2014 0552    HgbA1C: No results found for: HGBA1C  Urine Drug Screen:  No results found for: LABOPIA, COCAINSCRNUR, LABBENZ,  AMPHETMU, THCU, LABBARB  Alcohol Level: No results for input(s): ETH in the last 168 hours.  Other results: EKG: normal EKG, normal sinus rhythm, unchanged from previous tracings.  Imaging: Dg Chest 2 View  12/01/2014   CLINICAL DATA:  Left-sided chest pain radiating into left upper extremity. Shortness of breath.  EXAM: CHEST  2 VIEW  COMPARISON:  October 24, 2014  FINDINGS: There is no edema or consolidation. Heart size and pulmonary vascularity are normal. No adenopathy. No pneumothorax. There is slight lower thoracic levoscoliosis.  IMPRESSION: No edema or consolidation.   Electronically Signed   By: Bretta Bang III M.D.   On: 12/01/2014 18:13   Ct Head Wo Contrast  12/02/2014   CLINICAL DATA:  44 year old male with left side weakness. Left neck and chest pain radiating to the back, 10 out of 10. Right middle lobe pneumonia diagnosed yesterday on chest CTA.  EXAM: CT HEAD WITHOUT CONTRAST  CT CERVICAL SPINE WITHOUT CONTRAST  TECHNIQUE: Multidetector CT imaging of the head and cervical spine was performed following the standard protocol without intravenous  contrast. Multiplanar CT image reconstructions of the cervical spine were also generated.  COMPARISON:  None.  FINDINGS: CT HEAD FINDINGS  Mild paranasal sinus mucosal thickening. Mastoids and tympanic cavities are clear. No acute osseous abnormality identified. Visualized orbits and scalp soft tissues are within normal limits.  Mild streak artifact through the posterior fossa and posterior hemispheres. Normal cerebral volume. No midline shift, ventriculomegaly, mass effect, evidence of mass lesion, intracranial hemorrhage or evidence of cortically based acute infarction. Gray-white matter differentiation is within normal limits throughout the brain.  CT CERVICAL SPINE FINDINGS  Reversal of cervical lordosis. Visualized skull base is intact. No atlanto-occipital dissociation. Cervicothoracic junction alignment is within normal limits. Bilateral  posterior element alignment is within normal limits. Cervical disc space loss and endplate spurring from C4-C5 to C6-C7. No definite spinal stenosis. No acute cervical spine fracture. Negative lung apices. Grossly intact visualized upper thoracic levels.  Negative visualized noncontrast paraspinal soft tissues.  IMPRESSION: 1.  Normal noncontrast CT appearance of the brain. 2. No acute fracture or listhesis identified in the cervical spine. Ligamentous injury is not excluded. 3. C4-C5 through C6-C7 chronic disc and endplate degeneration. 4. Mild paranasal sinus inflammation.   Electronically Signed   By: Odessa Fleming M.D.   On: 12/02/2014 12:41   Ct Cervical Spine Wo Contrast  12/02/2014   CLINICAL DATA:  44 year old male with left side weakness. Left neck and chest pain radiating to the back, 10 out of 10. Right middle lobe pneumonia diagnosed yesterday on chest CTA.  EXAM: CT HEAD WITHOUT CONTRAST  CT CERVICAL SPINE WITHOUT CONTRAST  TECHNIQUE: Multidetector CT imaging of the head and cervical spine was performed following the standard protocol without intravenous contrast. Multiplanar CT image reconstructions of the cervical spine were also generated.  COMPARISON:  None.  FINDINGS: CT HEAD FINDINGS  Mild paranasal sinus mucosal thickening. Mastoids and tympanic cavities are clear. No acute osseous abnormality identified. Visualized orbits and scalp soft tissues are within normal limits.  Mild streak artifact through the posterior fossa and posterior hemispheres. Normal cerebral volume. No midline shift, ventriculomegaly, mass effect, evidence of mass lesion, intracranial hemorrhage or evidence of cortically based acute infarction. Gray-white matter differentiation is within normal limits throughout the brain.  CT CERVICAL SPINE FINDINGS  Reversal of cervical lordosis. Visualized skull base is intact. No atlanto-occipital dissociation. Cervicothoracic junction alignment is within normal limits. Bilateral posterior  element alignment is within normal limits. Cervical disc space loss and endplate spurring from C4-C5 to C6-C7. No definite spinal stenosis. No acute cervical spine fracture. Negative lung apices. Grossly intact visualized upper thoracic levels.  Negative visualized noncontrast paraspinal soft tissues.  IMPRESSION: 1.  Normal noncontrast CT appearance of the brain. 2. No acute fracture or listhesis identified in the cervical spine. Ligamentous injury is not excluded. 3. C4-C5 through C6-C7 chronic disc and endplate degeneration. 4. Mild paranasal sinus inflammation.   Electronically Signed   By: Odessa Fleming M.D.   On: 12/02/2014 12:41   Ct Thoracic Spine Wo Contrast  12/02/2014   CLINICAL DATA:  LEFT-sided weakness. LEFT-sided cervicalgia/neck pain and chest pain. Radiations to the back. Ten out of 10 pain.  EXAM: CT THORACIC SPINE WITHOUT CONTRAST  TECHNIQUE: Multidetector CT imaging of the thoracic spine was performed without intravenous contrast administration. Multiplanar CT image reconstructions were also generated.  COMPARISON:  Chest CT 12/01/2014.  FINDINGS: Thoracic spinal alignment demonstrates a dextroconvex curve with the apex at T6-T7. 12 thoracic type vertebral bodies are present. Mild thoracic spondylosis is  present. T11-T12 degenerative disc disease with degenerative endplate changes. There is no bony central stenosis. No destructive osseous lesions. No bony central or foraminal stenosis.  IMPRESSION: Mild thoracic spondylosis.  No acute abnormality.   Electronically Signed   By: Andreas Newport M.D.   On: 12/02/2014 13:36   Ct Angio Chest Aorta W/cm &/or Wo/cm  12/01/2014   CLINICAL DATA:  Severe acute chest pain radiating to the back  EXAM: CT ANGIOGRAPHY CHEST WITH CONTRAST  TECHNIQUE: Multidetector CT imaging of the chest was performed using the standard protocol during bolus administration of intravenous contrast. Multiplanar CT image reconstructions and MIPs were obtained to evaluate the  vascular anatomy.  CONTRAST:  OMNIPAQUE IOHEXOL 350 MG/ML SOLN  COMPARISON:  None.  FINDINGS: There is adequate opacification of the pulmonary arteries. There is no pulmonary embolus. The main pulmonary artery, right main pulmonary artery and left main pulmonary arteries are normal in size. The heart size is normal. There is no pericardial effusion. The thoracic aorta is normal in caliber. There is no thoracic aortic dissection. There is no mediastinal hematoma.  There is right middle lobe hazy airspace disease most consistent with pneumonia.  There is no axillary, hilar, or mediastinal adenopathy.  There is no lytic or blastic osseous lesion.  The visualized portions of the upper abdomen are unremarkable.  Review of the MIP images confirms the above findings.  IMPRESSION: 1. No evidence of thoracic aortic dissection. 2. No evidence of pulmonary embolus. 3. Right middle lobe pneumonia.   Electronically Signed   By: Elige Ko   On: 12/01/2014 19:47     Assessment/Plan:  44 y.o. male with a known history of intermittent asthma, untreated hypertension presenting with chest pain.He describes one day duration of chest pain. Left chest in location, radiation to the back, "pain" for quality, 10/10 intensity no real worsening or relieving factors. Current pain in sternoclavicular. Weakness has improved, pt is ambulating w/out assistance.  No further L sided weakness, ambulated w/out assistance No need for further imaging from neuro stand point.  Do not think this is acute intracranial ischemia.  [name]   12/03/2014, 1:32 PM

## 2014-12-03 NOTE — Progress Notes (Signed)
Inpatient Diabetes Program Recommendations  AACE/ADA: New Consensus Statement on Inpatient Glycemic Control (2013)  Target Ranges:  Prepandial:   less than 140 mg/dL      Peak postprandial:   less than 180 mg/dL (1-2 hours)      Critically ill patients:  140 - 180 mg/dL   Results for OLAOLUWA, GRIEDER (MRN [id number]) as of 12/03/2014 13:14  Ref. Range 12/01/2014 17:59 12/01/2014 18:03 12/01/2014 21:05 12/01/2014 21:06 12/01/2014 23:03 12/02/2014 04:56 12/02/2014 10:43 12/03/2014 05:52  Glucose Latest Ref Range: 65-99 mg/dL 782 (H)       956 (H)    Reason for assessment: elevated lab glucose  Diabetes history: none known Outpatient Diabetes medications: none Current orders for Inpatient glycemic control: none  If steroids are going to continue, may consider checking blood sugars tid and hs and add Novolog correction insulin 0-9 units tid  Susette Racer, RN, [place], [place], CDE Diabetes Coordinator Inpatient Diabetes Program  [phone] (Team Pager) [phone] [facility] Office) 12/03/2014 1:14 PM

## 2014-12-03 NOTE — Progress Notes (Signed)
Patient's pain has improved today and has more mobility in left arm. Instructed patient to keep his arm still as much as he can to prevent discomfort. IV abx continued for pneumonia, also transitioned to azythromycin PO. Consults completed on patient today and interpretor was used for better understanding. Medication education given on propanolol and tapazole in both spanish and english. Patient instructed to keep heart monitor on due to monitoring his tachycardia. Patient running from 90's to one teens while moving. Will continue to monitor.

## 2014-12-03 NOTE — Care Management (Signed)
Patient her with clavicle pain.  Patient lives at home alone, and his girlfriend lives near by. 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  Patient requested information on Medicaid.  I provided him the contact information for [facility] medical Assistance.  Patient also provided with medication management application and [facility] application.  Patient expresses understanding of completing and submitting packet.  CM to follow for any additional needs

## 2014-12-03 NOTE — Progress Notes (Signed)
[facility] [place] [facility]   PATIENT NAME: Derrick Williamson    MR#:  [id number]  DATE OF BIRTH:  04-13-1971  SUBJECTIVE:  CHIEF COMPLAINT:  Patient is feeling much better today, reporting left upper extremity and lower extremity tingling and numbness associated with pain are improved. He is also reporting left clavicular pain with some swelling is much better with the current pain management.  REVIEW OF SYSTEMS:  CONSTITUTIONAL: No fever, fatigue or weakness.  EYES: No blurred or double vision.  EARS, NOSE, AND THROAT: No tinnitus or ear pain.  RESPIRATORY: No cough, shortness of breath, wheezing or hemoptysis.  CARDIOVASCULAR: No chest pain, orthopnea, edema.  GASTROINTESTINAL: No nausea, vomiting, diarrhea or abdominal pain.  GENITOURINARY: No dysuria, hematuria.  ENDOCRINE: No polyuria, nocturia,  HEMATOLOGY: No anemia, easy bruising or bleeding SKIN: No rash or lesion. MUSCULOSKELETAL: No joint pain or arthritis.  Reporting left clavicular pain NEUROLOGIC: Reporting improved tingling, numbness and weakness on left upper extremity and lower extremity. Denies any difficulty with swallowing PSYCHIATRY: No anxiety or depression.   DRUG ALLERGIES:  No Known Allergies  VITALS:  Blood pressure 142/52, pulse 110, temperature 98.6 F (37 C), temperature source Oral, resp. rate 16, height 5\' 7"  (1.702 m), weight 57.38 kg (126 lb 8 oz), SpO2 93 %.  PHYSICAL EXAMINATION:  GENERAL:  44 y.o.-year-old patient lying in the bed with no acute distress.  EYES: Pupils equal, round, reactive to light and accommodation. No scleral icterus. Extraocular muscles intact.  HEENT: Head atraumatic, normocephalic. Oropharynx and nasopharynx clear.  NECK:  Supple, no jugular venous distention. No thyroid enlargement, no tenderness.  LUNGS: Normal breath sounds bilaterally, no wheezing, rales,rhonchi or crepitation. No use of accessory muscles of respiration.  CARDIOVASCULAR:  S1, S2 normal. No murmurs, rubs, or gallops.  ABDOMEN: Soft, nontender, nondistended. Bowel sounds present. No organomegaly or mass.  EXTREMITIES: No pedal edema, cyanosis, or clubbing.  NEUROLOGIC: Cranial nerves II through XII are intact. Muscle strength 5 out of 5 in all extremities . Sensation intact. Gait not checked. Reflexes are 2+. No deviation of the angle of the mouth PSYCHIATRIC: The patient is alert and oriented x 3.  SKIN: No obvious rash, lesion, or ulcer.    LABORATORY PANEL:   CBC  Recent Labs Lab 12/03/14 0552  WBC 8.1  HGB 12.3*  HCT 36.8*  PLT 162   ------------------------------------------------------------------------------------------------------------------  Chemistries   Recent Labs Lab 12/01/14 1759 12/01/14 1803 12/03/14 0552  NA 130*  --  137  K 4.2  --  4.0  CL 94*  --  103  CO2 26  --  27  GLUCOSE 132*  --  193*  BUN 18  --  15  CREATININE 0.59*  --  0.55*  CALCIUM 9.1  --  9.1  MG  --  1.6*  --   AST 25  --   --   ALT 31  --   --   ALKPHOS 120  --   --   BILITOT 0.6  --   --    ------------------------------------------------------------------------------------------------------------------  Cardiac Enzymes  Recent Labs Lab 12/02/14 1043  TROPONINI 0.03   ------------------------------------------------------------------------------------------------------------------  RADIOLOGY:  Dg Chest 2 View  12/01/2014   CLINICAL DATA:  Left-sided chest pain radiating into left upper extremity. Shortness of breath.  EXAM: CHEST  2 VIEW  COMPARISON:  October 24, 2014  FINDINGS: There is no edema or consolidation. Heart size and pulmonary vascularity are normal. No adenopathy. No pneumothorax. There  is slight lower thoracic levoscoliosis.  IMPRESSION: No edema or consolidation.   Electronically Signed   By: Bretta Bang III M.D.   On: 12/01/2014 18:13   Ct Head Wo Contrast  12/02/2014   CLINICAL DATA:  44 year old male with left side  weakness. Left neck and chest pain radiating to the back, 10 out of 10. Right middle lobe pneumonia diagnosed yesterday on chest CTA.  EXAM: CT HEAD WITHOUT CONTRAST  CT CERVICAL SPINE WITHOUT CONTRAST  TECHNIQUE: Multidetector CT imaging of the head and cervical spine was performed following the standard protocol without intravenous contrast. Multiplanar CT image reconstructions of the cervical spine were also generated.  COMPARISON:  None.  FINDINGS: CT HEAD FINDINGS  Mild paranasal sinus mucosal thickening. Mastoids and tympanic cavities are clear. No acute osseous abnormality identified. Visualized orbits and scalp soft tissues are within normal limits.  Mild streak artifact through the posterior fossa and posterior hemispheres. Normal cerebral volume. No midline shift, ventriculomegaly, mass effect, evidence of mass lesion, intracranial hemorrhage or evidence of cortically based acute infarction. Gray-white matter differentiation is within normal limits throughout the brain.  CT CERVICAL SPINE FINDINGS  Reversal of cervical lordosis. Visualized skull base is intact. No atlanto-occipital dissociation. Cervicothoracic junction alignment is within normal limits. Bilateral posterior element alignment is within normal limits. Cervical disc space loss and endplate spurring from C4-C5 to C6-C7. No definite spinal stenosis. No acute cervical spine fracture. Negative lung apices. Grossly intact visualized upper thoracic levels.  Negative visualized noncontrast paraspinal soft tissues.  IMPRESSION: 1.  Normal noncontrast CT appearance of the brain. 2. No acute fracture or listhesis identified in the cervical spine. Ligamentous injury is not excluded. 3. C4-C5 through C6-C7 chronic disc and endplate degeneration. 4. Mild paranasal sinus inflammation.   Electronically Signed   By: Odessa Fleming M.D.   On: 12/02/2014 12:41   Ct Cervical Spine Wo Contrast  12/02/2014   CLINICAL DATA:  44 year old male with left side weakness.  Left neck and chest pain radiating to the back, 10 out of 10. Right middle lobe pneumonia diagnosed yesterday on chest CTA.  EXAM: CT HEAD WITHOUT CONTRAST  CT CERVICAL SPINE WITHOUT CONTRAST  TECHNIQUE: Multidetector CT imaging of the head and cervical spine was performed following the standard protocol without intravenous contrast. Multiplanar CT image reconstructions of the cervical spine were also generated.  COMPARISON:  None.  FINDINGS: CT HEAD FINDINGS  Mild paranasal sinus mucosal thickening. Mastoids and tympanic cavities are clear. No acute osseous abnormality identified. Visualized orbits and scalp soft tissues are within normal limits.  Mild streak artifact through the posterior fossa and posterior hemispheres. Normal cerebral volume. No midline shift, ventriculomegaly, mass effect, evidence of mass lesion, intracranial hemorrhage or evidence of cortically based acute infarction. Gray-white matter differentiation is within normal limits throughout the brain.  CT CERVICAL SPINE FINDINGS  Reversal of cervical lordosis. Visualized skull base is intact. No atlanto-occipital dissociation. Cervicothoracic junction alignment is within normal limits. Bilateral posterior element alignment is within normal limits. Cervical disc space loss and endplate spurring from C4-C5 to C6-C7. No definite spinal stenosis. No acute cervical spine fracture. Negative lung apices. Grossly intact visualized upper thoracic levels.  Negative visualized noncontrast paraspinal soft tissues.  IMPRESSION: 1.  Normal noncontrast CT appearance of the brain. 2. No acute fracture or listhesis identified in the cervical spine. Ligamentous injury is not excluded. 3. C4-C5 through C6-C7 chronic disc and endplate degeneration. 4. Mild paranasal sinus inflammation.   Electronically Signed   By:  Odessa Fleming M.D.   On: 12/02/2014 12:41   Ct Thoracic Spine Wo Contrast  12/02/2014   CLINICAL DATA:  LEFT-sided weakness. LEFT-sided cervicalgia/neck pain  and chest pain. Radiations to the back. Ten out of 10 pain.  EXAM: CT THORACIC SPINE WITHOUT CONTRAST  TECHNIQUE: Multidetector CT imaging of the thoracic spine was performed without intravenous contrast administration. Multiplanar CT image reconstructions were also generated.  COMPARISON:  Chest CT 12/01/2014.  FINDINGS: Thoracic spinal alignment demonstrates a dextroconvex curve with the apex at T6-T7. 12 thoracic type vertebral bodies are present. Mild thoracic spondylosis is present. T11-T12 degenerative disc disease with degenerative endplate changes. There is no bony central stenosis. No destructive osseous lesions. No bony central or foraminal stenosis.  IMPRESSION: Mild thoracic spondylosis.  No acute abnormality.   Electronically Signed   By: Andreas Newport M.D.   On: 12/02/2014 13:36   Ct Angio Chest Aorta W/cm &/or Wo/cm  12/01/2014   CLINICAL DATA:  Severe acute chest pain radiating to the back  EXAM: CT ANGIOGRAPHY CHEST WITH CONTRAST  TECHNIQUE: Multidetector CT imaging of the chest was performed using the standard protocol during bolus administration of intravenous contrast. Multiplanar CT image reconstructions and MIPs were obtained to evaluate the vascular anatomy.  CONTRAST:  OMNIPAQUE IOHEXOL 350 MG/ML SOLN  COMPARISON:  None.  FINDINGS: There is adequate opacification of the pulmonary arteries. There is no pulmonary embolus. The main pulmonary artery, right main pulmonary artery and left main pulmonary arteries are normal in size. The heart size is normal. There is no pericardial effusion. The thoracic aorta is normal in caliber. There is no thoracic aortic dissection. There is no mediastinal hematoma.  There is right middle lobe hazy airspace disease most consistent with pneumonia.  There is no axillary, hilar, or mediastinal adenopathy.  There is no lytic or blastic osseous lesion.  The visualized portions of the upper abdomen are unremarkable.  Review of the MIP images confirms the  above findings.  IMPRESSION: 1. No evidence of thoracic aortic dissection. 2. No evidence of pulmonary embolus. 3. Right middle lobe pneumonia.   Electronically Signed   By: Elige Ko   On: 12/01/2014 19:47    EKG:   Orders placed or performed during the hospital encounter of 12/01/14  . ED EKG  . ED EKG  . EKG 12-Lead  . EKG 12-Lead    ASSESSMENT AND PLAN:   44 year old Hispanic gentleman history of intermittent asthma, untreated hyperthyroidism presented with chest pain.  #. Left upper extremity and lower extremity weakness with tingling and numbness-unclear etiology but resolved completely CT head is negative Will discontinue aspirin and statin for possible TIA as his total cholesterol is at 93 and LDL at 52 Appreciate neurology consult. Follow-up with physical therapy. CT of the cervical spine and thoracic spine with some degenerative changes  #Left clavicular pain secondary to anterior sternoclavicular subluxation Denies any trauma or injury Appreciate orthopedic recommendations. No interventions needed from the standpoint PT consult is placed for evaluation and treatment  #. Chest pain, left-sided: Can be pleuritic chest pain Ruled out acute coronary syndrome with negative cardiac enzyme trend  Continue monitoring the patient on telemetry Discontinue  aspirin and statin CPR is not elevated, 1 dose of Toradol was given which was not helpful to the patient's pain, I doubt patient has acute pericarditis, no pericardial rub  # Community acquired pneumonia: Antibiotics coverage with ceftriaxone and azithromycin, oxygen as required, DuoNeb treatments as required, follow culture data  #Acute  exacerbation of COPD Clinically improving, taper steroids  DuoNeb nebulizer treatments every 6 hours Albuterol neb liters every 4 hours as needed basis  #. Hyperthyroidism, untreated: No evidence of hyperthyroid storm Patient is started on methimazole and propranolol for tachycardia by  [name] Appreciate endocrinology recommendations  #. Hyponatremia: Resolved with IV fluids   DVT prophylaxis with heparin subcutaneous  # Tobacco abuse disorder-counseled patient to quit smoking for 3-5 minutes. We will provide nicotine patch, patient is agreeable for nicotine patch     All the records are reviewed and case discussed with Care Management/Social Workerr. Management plans discussed with the patient, family and they are in agreement.  CODE STATUS: Full code  TOTAL TIME TAKING CARE OF THIS PATIENT with the help of Spanish interpreter Mr. Justice Britain -35  minutes.   POSSIBLE D/C IN 1-2  DAYS, DEPENDING ON CLINICAL CONDITION.   [name] M.D on 12/03/2014 at 2:42 PM  Between 7am to 6pm - Pager - [phone] After 6pm go to [email or web address] - password [email or web address] [facility]  [place] [facility]  Office  [phone]  CC: Primary care physician; No primary care provider on file. the

## 2014-12-03 NOTE — Consult Note (Signed)
ENDOCRINOLOGY CONSULTATION  REFERRING PHYSICIAN:  [name], MD CONSULTING PHYSICIAN:  A. Wendall Mola, MD  Chief complaint Hyperthyroidism  History of present illness Derrick Williamson is a 44 y.o. seen in consultation for hyperthyroidism at the request of Dr. Amado Coe. He presented two days ago with chest/clavicular pain and was found to have RML pneumonia and a low TSH of 0.025 uIU/ml and high free T4 of >5.5 ng/dl. He recalls he was diagnosed with hyperthyroidism about 8 months ago while living in [place]. He was prescribed medication, which he never took as it was too costly. He cannot recall what it was called.   He denies neck pain or fever. No known use of amiodarone, lithium, glucocorticoids. He did receive iodinated contrast dye for a CT scan on 12/01/14. He has had daily heart racing. No palpitations. No tremor. He has had a 70 lb unintentional weight loss over the last 12 months. He has heat intolerance and anxiety.  Medical history Asthma   Hyperthyroidism Tobacco dependence  Surgical history History reviewed. No pertinent past surgical history.   Medications . aspirin EC  81 mg Oral Daily  . azithromycin  500 mg Oral Daily  . cefTRIAXone (ROCEPHIN)  IV  1 g Intravenous Q24H  . docusate sodium  100 mg Oral BID  . famotidine  20 mg Oral BID  . gabapentin  100 mg Oral BID  . heparin  5,000 Units Subcutaneous 3 times per day  . ipratropium-albuterol  3 mL Nebulization Q6H        . methylPREDNISolone (SOLU-MEDROL) injection  60 mg Intravenous 3 times per day  . nicotine  14 mg Transdermal Daily  . sodium chloride  3 mL Intravenous Q12H     Social history Lives with girlfriend. Moved 3 months ago from [place]. Not employed. Does not have medical insurance. Social History  Substance Use Topics  . Smoking status: Current Every Day Smoker  . Smokeless tobacco: None  . Alcohol Use: No     Family history Family History  Problem Relation Age of Onset  . Heart failure  Neg Hx      Review of systems GENERAL:  Patient denies fevers.  Patient has fatigue. Patient denies weight gain. NEUROLOGIC:  Patient denies headaches. EYES:  Patient denies blurred vision. NECK:  Patient denies neck swelling, neck pain or difficulty with swallowing. RESPIRATORY:  Patient has had SOB.  Patient has a cough.   GASTROINTESTINAL:  Patient denies abdominal pain, nausea and vomiting and constipation. Patient denies blood in the stool. GENITOURINARY:  Patient denies dysuria and hematuria.  EXTREMITIES:  Patient denies leg swelling. HEME:  Patient denies easy bruisability. SKIN:  Patient denies any rash.   Exam BP 142/52 mmHg  Pulse 110  Temp(Src) 98.6 F (37 C) (Oral)  Resp 16  Ht  (1.702 m)  Wt 57.38 kg (126 lb 8 oz)  BMI 19.81 kg/m2  SpO2 93%   GEN: Anxious appearing thin Hispanic male, in NAD. HEENT: No proptosis, EOMI, lid lag or stare. Oropharynx is clear.  NECK: supple, trachea midline. Thyroid exam demonstrates diffuse thyroid enlargement without palpable nodules or TTP. No thyroid bruit. RESPIRATORY: clear bilaterally, no wheeze, good inspiratory effort. CV: No carotid bruits, RRR. MUSCULOSKELETAL: normal gait and station, no clubbing,  + tremor of outstretched hands.  ABD: soft, NT/ND, no organomegaly EXT: no peripheral edema SKIN: no dermatopathy or rash or acanthosis nigricans.  LYMPH: no submandibular or supraclavicular LAD NEURO: PERRL, 2+ knee DTRs equal and symmetric.  PSYC:  alert and oriented, good insight   Laboratory/Radiology Results for orders placed or performed during the hospital encounter of 12/01/14 (from the past 24 hour(s))  Basic metabolic panel     Status: Abnormal   Collection Time: 12/03/14  5:52 AM  Result Value Ref Range   Sodium 137 135 - 145 mmol/L   Potassium 4.0 3.5 - 5.1 mmol/L   Chloride 103 101 - 111 mmol/L   CO2 27 22 - 32 mmol/L   Glucose, Bld 193 (H) 65 - 99 mg/dL   BUN 15 6 - 20 mg/dL   Creatinine, Ser 1.61  (L) 0.61 - 1.24 mg/dL   Calcium 9.1 8.9 - 09.6 mg/dL   GFR calc non Af Amer >60 >60 mL/min   GFR calc Af Amer >60 >60 mL/min   Anion gap 7 5 - 15  CBC     Status: Abnormal   Collection Time: 12/03/14  5:52 AM  Result Value Ref Range   WBC 8.1 3.8 - 10.6 K/uL   RBC 4.22 (L) 4.40 - 5.90 MIL/uL   Hemoglobin 12.3 (L) 13.0 - 18.0 g/dL   HCT 04.5 (L) 40.9 - 81.1 %   MCV 87.1 80.0 - 100.0 fL   MCH 29.2 26.0 - 34.0 pg   MCHC 33.6 32.0 - 36.0 g/dL   RDW 91.4 78.2 - 95.6 %   Platelets 162 150 - 440 K/uL  Lipid panel     Status: Abnormal   Collection Time: 12/03/14  5:52 AM  Result Value Ref Range   Cholesterol 93 0 - 200 mg/dL   Triglycerides 28 <213 mg/dL   HDL 35 (L) >08 mg/dL   Total CHOL/HDL Ratio 2.7 RATIO   VLDL 6 0 - 40 mg/dL   LDL Cholesterol 52 0 - 99 mg/dL   Lab Results  Component Value Date   TSH 0.025* 12/01/2014     Assessment Hyperthyroidism, likely due to Graves' Disease  Plan We discussed hyperthyroidism, its signs, symptoms, causes, natural history and treatment.   I will start methimazole 20 mg daily. He should continue this until follow up.  I will arrange that he see me in 1-2 weeks after discharge. As he lacks medical insurance, I will send a consult to [facility] where he likely will qualify for their [facility] program, allowing him to get office visits, procedures, and medications at a reduced cost.   Will also add propranolol 80 mg BID for tachycardia.   Avoid further doses of contrast dye as this could precipitate thyroid storm.  Thank you for the kind request for consultation.

## 2014-12-03 NOTE — Evaluation (Signed)
Physical Therapy Evaluation Patient Details Name: Derrick Williamson MRN: [id number] DOB: 1971-04-13 Today's Date: 12/03/2014   History of Present Illness  presented to ER secondary to chest pain, cough and SOB; admitted with R LL pneumonia.  Also noted with L sternoclavicular subluxation; no intervention required at this time per ortho.  Clinical Impression  Upon evaluation, patient alert and oriented; impulsive and erratic in movement, activity and conversation.  Demonstrates functional ROM throughout L UE (painful with greater than 110-120 degrees of elevation); rates pain 8-9/10 (though pain-rating slightly inconsistent with functional performance).  Able to complete all mobility without assist device, indep; no LOB or safety concerns.   Attempted to educate in movement precautions (range as tolerated through pain-free motion) and educated to avoid excessive horiz abduction/retraction.  Patient voiced understanding, but impulsively moves L UE throughout 'avoided' motions while discussing with therapist.  Patient very impulsive and erratic; unable to follow/comply with formal HEP at this time beyond education to avoid repetitive motions that cause pain. Patient with no acute PT needs at this time; will discontinue skilled PT order at this time. Please re-consult should needs change.    Follow Up Recommendations No PT follow up    Equipment Recommendations       Recommendations for Other Services       Precautions / Restrictions Restrictions Weight Bearing Restrictions: No      Mobility  Bed Mobility Overal bed mobility: Independent                Transfers Overall transfer level: Independent                  Ambulation/Gait Ambulation/Gait assistance: Independent Ambulation Distance (Feet): 220 Feet         General Gait Details: reciprocal stepping with good cadence and gait speed; no overt LOB or safety concern  Stairs            Wheelchair Mobility     Modified Rankin (Stroke Patients Only)       Balance Overall balance assessment: Modified Independent;Independent                                           Pertinent Vitals/Pain      Home Living Family/patient expects to be discharged to:: Private residence Living Arrangements: Spouse/significant other Available Help at Discharge: Family Type of Home: House Home Access: Stairs to enter   Secretary/administrator of Steps: 4 Home Layout: One level        Prior Function Level of Independence: Independent         Comments: Indep with household, community activities; works full-time     Higher education careers adviser        Extremity/Trunk Assessment   Upper Extremity Assessment: Overall WFL for tasks assessed (reports pain in L shoudler with elevation past 110-110 degrees in all planes; strength at least 4-/5 (not tested with resistance due to pain))           Lower Extremity Assessment: Overall WFL for tasks assessed         Communication   Communication: Interpreter utilized (Spanish-speaking; Conservation officer, historic buildings present for interpretation)  Cognition Arousal/Alertness: Awake/alert Behavior During Therapy: WFL for tasks assessed/performed;Impulsive Overall Cognitive Status: Within Functional Limits for tasks assessed                      General Comments  Exercises Other Exercises Other Exercises: Attempted to educate in movement precautions (range as tolerated through pain-free motion) and educated to avoid excessive horiz abduction/retraction.  Patient voiced understanding, but impulsively moves L UE throughout 'avoided' motions while discussing with therapist.  Patient very impulsive and erratic; unable to follow/comply with formal HEP at this time beyond education to avoid repetitive motions that cause pain.      Assessment/Plan    PT Assessment Patent does not need any further PT services  PT Diagnosis     PT Problem List    PT  Treatment Interventions     PT Goals (Current goals can be found in the Care Plan section) Acute Rehab PT Goals PT Goal Formulation: All assessment and education complete, DC therapy    Frequency     Barriers to discharge        Co-evaluation               End of Session   Activity Tolerance: Patient tolerated treatment well Patient left: in bed;with call bell/phone within reach Nurse Communication: Mobility status    Functional Assessment Tool Used: clinical judgement Functional Limitation: Mobility: Walking and moving around Mobility: Walking and Moving Around Current Status [phone]): 0 percent impaired, limited or restricted Mobility: Walking and Moving Around Goal Status [phone]): 0 percent impaired, limited or restricted Mobility: Walking and Moving Around Discharge Status [phone]): 0 percent impaired, limited or restricted    Time: 1027-1036 PT Time Calculation (min) (ACUTE ONLY): 9 min   Charges:   PT Evaluation $Initial PT Evaluation Tier I: 1 Procedure     PT G Codes:   PT G-Codes **NOT FOR INPATIENT CLASS** Functional Assessment Tool Used: clinical judgement Functional Limitation: Mobility: Walking and moving around Mobility: Walking and Moving Around Current Status ([id number]): 0 percent impaired, limited or restricted Mobility: Walking and Moving Around Goal Status ([id number]): 0 percent impaired, limited or restricted Mobility: Walking and Moving Around Discharge Status ([id number]): 0 percent impaired, limited or restricted    Adrie Picking H. Manson Passey, PT, DPT, NCS 12/03/2014, 1:52 PM [phone]

## 2014-12-04 ENCOUNTER — Observation Stay: Payer: Self-pay

## 2014-12-04 LAB — T3, FREE: T3, Free: 9.8 pg/mL — ABNORMAL HIGH (ref 2.0–4.4)

## 2014-12-04 MED ORDER — NICOTINE 14 MG/24HR TD PT24: 14.0000 mg | MEDICATED_PATCH | Freq: Every day | TRANSDERMAL | Status: DC

## 2014-12-04 MED ORDER — METHIMAZOLE 10 MG PO TABS: 20.0000 mg | ORAL_TABLET | Freq: Every day | ORAL | Status: DC

## 2014-12-04 MED ORDER — AZITHROMYCIN 250 MG PO TABS: 500.0000 mg | ORAL_TABLET | Freq: Every day | ORAL | Status: DC

## 2014-12-04 MED ORDER — ALBUTEROL SULFATE HFA 108 (90 BASE) MCG/ACT IN AERS: 2.0000 | INHALATION_SPRAY | Freq: Four times a day (QID) | RESPIRATORY_TRACT | Status: DC | PRN

## 2014-12-04 MED ORDER — PROPRANOLOL HCL 80 MG PO TABS: 80.0000 mg | ORAL_TABLET | Freq: Two times a day (BID) | ORAL | Status: DC

## 2014-12-04 MED ORDER — TIOTROPIUM BROMIDE MONOHYDRATE 18 MCG IN CAPS: 18.0000 ug | ORAL_CAPSULE | Freq: Every morning | RESPIRATORY_TRACT | Status: DC

## 2014-12-04 MED ORDER — PREDNISONE 10 MG (21) PO TBPK: 10.0000 mg | ORAL_TABLET | Freq: Every day | ORAL | Status: DC

## 2014-12-04 MED ORDER — ACETAMINOPHEN 325 MG PO TABS: 650.0000 mg | ORAL_TABLET | Freq: Four times a day (QID) | ORAL | Status: DC | PRN

## 2014-12-04 NOTE — Care Management Note (Signed)
Case Management Note  Patient Details  Name: Jachob Mcclean MRN: [id number] Date of Birth: Apr 08, 1971  Subjective/Objective:      Provided Mr Baldwin [place] with a generic drug discount card, an application to the [facility], and which meds he can get at [facility] for $4.00. Provided him with a list of clinics where he can see a PA or MD and get medication for little or no cost. Also encouraged Mr Baldwin [place] to apply for Medicaid per he is currently uninsured. A Brewing technologist facilitated this communication with Mr Baldwin [place].                Action/Plan:   Expected Discharge Date:                  Expected Discharge Plan:     In-House Referral:     Discharge planning Services     Post Acute Care Choice:    Choice offered to:     DME Arranged:    DME Agency:     HH Arranged:    HH Agency:     Status of Service:     Medicare Important Message Given:    Date Medicare IM Given:    Medicare IM give by:    Date Additional Medicare IM Given:    Additional Medicare Important Message give by:     If discussed at Long Length of Stay Meetings, dates discussed:    Additional Comments:  Vaness Jelinski A, RN 12/04/2014, 12:22 PM

## 2014-12-04 NOTE — Progress Notes (Signed)
Patient is discharge home in a stable condition , no distress noted at time of discharge, denies pain , summary given and instruction to call PMD on Monday for 1 week f/u appt. Via interpreter, verbalized understanding , left with girlfriend

## 2014-12-04 NOTE — Discharge Instructions (Signed)
Activity as tolerated Diet-regular Continue nicotine patch over-the-counter Follow-up with primary care physician or [facility] in a week Follow-up with endocrinology in 2 -4weeks

## 2014-12-04 NOTE — Discharge Summary (Signed)
[name]   PATIENT NAME: Derrick Williamson    MR#:  [id number]  DATE OF BIRTH:  Jun 29, 1970  DATE OF ADMISSION:  12/01/2014 ADMITTING PHYSICIAN: Wyatt Haste, MD  DATE OF DISCHARGE: 12/04/2014  PRIMARY CARE PHYSICIAN: No primary care provider on file.    ADMISSION DIAGNOSIS:  Hyperthyroidism [E05.90] ST segment elevation [R09.89] Right middle lobe pneumonia [J18.9] Left sided chest pain [R07.9]  DISCHARGE DIAGNOSIS:  Active Problems:   Left sided chest pain   Community acquired pneumonia  acute exacerbation of COPD Hyperthyroidism  SECONDARY DIAGNOSIS:   Past Medical History  Diagnosis Date  . Asthma     HOSPITAL COURSE:   44 year old Hispanic gentleman history of intermittent asthma, untreated hyperthyroidism presented with chest pain.  #. Left upper extremity and lower extremity weakness with tingling and numbness-unclear etiology but resolved completely CT head is negative Initially treated with aspirin and statin for possible TIA, but were discontinued as his total cholesterol is at 93 and LDL at 52 Appreciate neurology consult. No further recommendations by neurology. Patient was seen by physical therapy who has no further recommendations. CT of the cervical spine and thoracic spine with some degenerative changes  #Left clavicular pain secondary to anterior sternoclavicular subluxation Denies any trauma or injury Appreciate orthopedic recommendations. No interventions needed from their standpoint PT consult is placed and patient was evaluated by them with no further PT needs   #. Chest pain, left-sided: Can be pleuritic chest pain Ruled out acute coronary syndrome with negative cardiac enzyme trend  Continue monitoring the patient on telemetry Discontinue aspirin and statin CPR is not elevated, 1 dose of Toradol was given which was not helpful to the patient's pain, I doubt patient has acute pericarditis, no  pericardial rub  # Community acquired pneumonia:  Antibiotics coverage with ceftriaxone and azithromycin, weaned off oxygen to room air Clinically improved .chest x-ray with no pneumonia, I don't think he needs antibiotics to go home  DuoNeb treatments were provided during the hospital course   #Acute exacerbation of COPD Clinically improving, tapered steroids to by mouth prednisone Clinically improved withDuoNeb nebulizer treatments every 6 hours Albuterol neb liters every 4 hours as needed basis Will discharge him home with albuterol inhalers and the Spiriva  #. Hyperthyroidism, untreated: No evidence of hyperthyroid storm Patient is started on methimazole and propranolol for tachycardia by [name] Appreciate endocrinology recommendations Outpatient follow-up with endocrinology in 2-4 weeks is recommended  #. Hyponatremia: Resolved with IV fluids   DVT prophylaxis with heparin subcutaneous  # Tobacco abuse disorder-counseled patient to quit smoking for 3-5 minutes. We will provide nicotine patch, patient is agreeable for nicotine patch    DISCHARGE CONDITIONS:   Fair  CONSULTS OBTAINED:  Treatment Team:  [name], MD [name], MD [name], MD   PROCEDURES none  DRUG ALLERGIES:  No Known Allergies  DISCHARGE MEDICATIONS:   Current Discharge Medication List    START taking these medications   Details  acetaminophen (TYLENOL) 325 MG tablet Take 2 tablets (650 mg total) by mouth every 6 (six) hours as needed for mild pain (or Fever >/= 101).    methimazole (TAPAZOLE) 10 MG tablet Take 2 tablets (20 mg total) by mouth daily. Qty: 30 tablet, Refills: 0    nicotine (NICODERM CQ - DOSED IN MG/24 HOURS) 14 mg/24hr patch Place 1 patch (14 mg total) onto the skin daily. Qty: 28 patch, Refills: 0    predniSONE (STERAPRED UNI-PAK 21  TAB) 10 MG (21) TBPK tablet Take 1 tablet (10 mg total) by mouth daily. Take 6 tablets by mouth for 1 day followed by  5  tablets by mouth for 1 day followed by  4 tablets by mouth for 1 day followed by  3 tablets by mouth for 1 day followed by  2 tablets by mouth for 1 day followed by  1 tablet by mouth for a day and stop Qty: 21 tablet, Refills: 0    propranolol (INDERAL) 80 MG tablet Take 1 tablet (80 mg total) by mouth 2 (two) times daily. Qty: 60 tablet, Refills: 0    tiotropium (SPIRIVA HANDIHALER) 18 MCG inhalation capsule Place 1 capsule (18 mcg total) into inhaler and inhale every morning. Qty: 30 capsule, Refills: 12      CONTINUE these medications which have CHANGED   Details  albuterol (PROVENTIL HFA;VENTOLIN HFA) 108 (90 BASE) MCG/ACT inhaler Inhale 2 puffs into the lungs every 6 (six) hours as needed for wheezing or shortness of breath. Qty: 1 Inhaler, Refills: 0      CONTINUE these medications which have NOT CHANGED   Details  azithromycin (ZITHROMAX Z-PAK) 250 MG tablet Take 2 tablets (500 mg) on  Day 1,  followed by 1 tablet (250 mg) once daily on Days 2 through 5. Qty: 6 each, Refills: 0    naproxen (NAPROSYN) 500 MG tablet Take 1 tablet (500 mg total) by mouth 2 (two) times daily with a meal. Qty: 60 tablet, Refills: 0      STOP taking these medications     predniSONE (DELTASONE) 10 MG tablet          DISCHARGE INSTRUCTIONS:   Continue nicotine patch over-the-counter Follow-up with primary care physician in a week Follow-up with endocrinology [name] in 2-4 weeks or sooner as needed  DIET:  Regular diet  DISCHARGE CONDITION:  Fair  ACTIVITY:  Activity as tolerated  OXYGEN:  Home Oxygen: No.   Oxygen Delivery: room air  DISCHARGE LOCATION:  home   If you experience worsening of your admission symptoms, develop shortness of breath, life threatening emergency, suicidal or homicidal thoughts you must seek medical attention immediately by calling 911 or calling your MD immediately  if symptoms less severe.  You Must read complete instructions/literature along  with all the possible adverse reactions/side effects for all the Medicines you take and that have been prescribed to you. Take any new Medicines after you have completely understood and accpet all the possible adverse reactions/side effects.   Please note  You were cared for by a hospitalist during your hospital stay. If you have any questions about your discharge medications or the care you received while you were in the hospital after you are discharged, you can call the unit and asked to speak with the hospitalist on call if the hospitalist that took care of you is not available. Once you are discharged, your primary care physician will handle any further medical issues. Please note that NO REFILLS for any discharge medications will be authorized once you are discharged, as it is imperative that you return to your primary care physician (or establish a relationship with a primary care physician if you do not have one) for your aftercare needs so that they can reassess your need for medications and monitor your lab values.     Today  Chief Complaint  Patient presents with  . Clavicle Injury   Patient is feeling better. Denies any chest pain or shortness of  breath. Left-sided weakness and tingling are completely resolved. Right clavicle pain is resolved. Reporting right forearm swelling thinks from IV infiltration  ROS:  CONSTITUTIONAL: Denies fevers, chills. Denies any fatigue, weakness.  EYES: Denies blurry vision, double vision, eye pain. EARS, NOSE, THROAT: Denies tinnitus, ear pain, hearing loss. RESPIRATORY: Denies cough, wheeze, shortness of breath.  CARDIOVASCULAR: Denies chest pain, palpitations, edema.  GASTROINTESTINAL: Denies nausea, vomiting, diarrhea, abdominal pain. Denies bright red blood per rectum. GENITOURINARY: Denies dysuria, hematuria. ENDOCRINE: Denies nocturia or thyroid problems. HEMATOLOGIC AND LYMPHATIC: Denies easy bruising or bleeding. SKIN: Denies rash or  lesion. MUSCULOSKELETAL: Denies pain in neck, back, shoulder, knees, hips or arthritic symptoms. Right forearm swelling NEUROLOGIC: Denies paralysis, paresthesias.  PSYCHIATRIC: Denies anxiety or depressive symptoms.   VITAL SIGNS:  Blood pressure 139/84, pulse 67, temperature 98 F (36.7 C), temperature source Oral, resp. rate 24, height 5\' 7"  (1.702 m), weight 57.38 kg (126 lb 8 oz), SpO2 100 %.  I/O:   Intake/Output Summary (Last 24 hours) at 12/04/14 1309 Last data filed at 12/04/14 0830  Gross per 24 hour  Intake   1165 ml  Output      0 ml  Net   1165 ml    PHYSICAL EXAMINATION:  GENERAL:  44 y.o.-year-old patient lying in the bed with no acute distress.  EYES: Pupils equal, round, reactive to light and accommodation. No scleral icterus. Extraocular muscles intact.  HEENT: Head atraumatic, normocephalic. Oropharynx and nasopharynx clear.  NECK:  Supple, no jugular venous distention. No thyroid enlargement, no tenderness.  LUNGS: Normal breath sounds bilaterally, no wheezing, rales,rhonchi or crepitation. No use of accessory muscles of respiration.  CARDIOVASCULAR: S1, S2 normal. No murmurs, rubs, or gallops.  ABDOMEN: Soft, non-tender, non-distended. Bowel sounds present. No organomegaly or mass.  EXTREMITIES: No pedal edema, cyanosis, or clubbing. Right forearm is edematous with seepage of fluid NEUROLOGIC: Cranial nerves II through XII are intact. Muscle strength 5/5 in all extremities. Sensation intact. Gait not checked.  PSYCHIATRIC: The patient is alert and oriented x 3.  SKIN: No obvious rash, lesion, or ulcer.   DATA REVIEW:   CBC  Recent Labs Lab 12/03/14 0552  WBC 8.1  HGB 12.3*  HCT 36.8*  PLT 162    Chemistries   Recent Labs Lab 12/01/14 1759 12/01/14 1803 12/03/14 0552  NA 130*  --  137  K 4.2  --  4.0  CL 94*  --  103  CO2 26  --  27  GLUCOSE 132*  --  193*  BUN 18  --  15  CREATININE 0.59*  --  0.55*  CALCIUM 9.1  --  9.1  MG  --  1.6*   --   AST 25  --   --   ALT 31  --   --   ALKPHOS 120  --   --   BILITOT 0.6  --   --     Cardiac Enzymes  Recent Labs Lab 12/02/14 1043  TROPONINI 0.03    Microbiology Results  Results for orders placed or performed during the hospital encounter of 12/01/14  Blood culture (routine x 2)     Status: None (Preliminary result)   Collection Time: 12/01/14  9:05 PM  Result Value Ref Range Status   Specimen Description BLOOD BLOOD RIGHT FOREARM  Final   Special Requests BOTTLES DRAWN AEROBIC AND ANAEROBIC  Final   Culture NO GROWTH 2 DAYS  Final   Report Status PENDING  Incomplete  Blood  culture (routine x 2)     Status: None (Preliminary result)   Collection Time: 12/01/14  9:06 PM  Result Value Ref Range Status   Specimen Description BLOOD LEFT ASSIST CONTROL  Final   Special Requests BOTTLES DRAWN AEROBIC AND ANAEROBIC  Final   Culture NO GROWTH 2 DAYS  Final   Report Status PENDING  Incomplete    RADIOLOGY:  Dg Chest 2 View  12/01/2014   CLINICAL DATA:  Left-sided chest pain radiating into left upper extremity. Shortness of breath.  EXAM: CHEST  2 VIEW  COMPARISON:  October 24, 2014  FINDINGS: There is no edema or consolidation. Heart size and pulmonary vascularity are normal. No adenopathy. No pneumothorax. There is slight lower thoracic levoscoliosis.  IMPRESSION: No edema or consolidation.   Electronically Signed   By: Bretta Bang III M.D.   On: 12/01/2014 18:13   Ct Head Wo Contrast  12/02/2014   CLINICAL DATA:  44 year old male with left side weakness. Left neck and chest pain radiating to the back, 10 out of 10. Right middle lobe pneumonia diagnosed yesterday on chest CTA.  EXAM: CT HEAD WITHOUT CONTRAST  CT CERVICAL SPINE WITHOUT CONTRAST  TECHNIQUE: Multidetector CT imaging of the head and cervical spine was performed following the standard protocol without intravenous contrast. Multiplanar CT image reconstructions of the cervical spine were also generated.   COMPARISON:  None.  FINDINGS: CT HEAD FINDINGS  Mild paranasal sinus mucosal thickening. Mastoids and tympanic cavities are clear. No acute osseous abnormality identified. Visualized orbits and scalp soft tissues are within normal limits.  Mild streak artifact through the posterior fossa and posterior hemispheres. Normal cerebral volume. No midline shift, ventriculomegaly, mass effect, evidence of mass lesion, intracranial hemorrhage or evidence of cortically based acute infarction. Gray-white matter differentiation is within normal limits throughout the brain.  CT CERVICAL SPINE FINDINGS  Reversal of cervical lordosis. Visualized skull base is intact. No atlanto-occipital dissociation. Cervicothoracic junction alignment is within normal limits. Bilateral posterior element alignment is within normal limits. Cervical disc space loss and endplate spurring from C4-C5 to C6-C7. No definite spinal stenosis. No acute cervical spine fracture. Negative lung apices. Grossly intact visualized upper thoracic levels.  Negative visualized noncontrast paraspinal soft tissues.  IMPRESSION: 1.  Normal noncontrast CT appearance of the brain. 2. No acute fracture or listhesis identified in the cervical spine. Ligamentous injury is not excluded. 3. C4-C5 through C6-C7 chronic disc and endplate degeneration. 4. Mild paranasal sinus inflammation.   Electronically Signed   By: Odessa Fleming M.D.   On: 12/02/2014 12:41   Ct Cervical Spine Wo Contrast  12/02/2014   CLINICAL DATA:  44 year old male with left side weakness. Left neck and chest pain radiating to the back, 10 out of 10. Right middle lobe pneumonia diagnosed yesterday on chest CTA.  EXAM: CT HEAD WITHOUT CONTRAST  CT CERVICAL SPINE WITHOUT CONTRAST  TECHNIQUE: Multidetector CT imaging of the head and cervical spine was performed following the standard protocol without intravenous contrast. Multiplanar CT image reconstructions of the cervical spine were also generated.  COMPARISON:   None.  FINDINGS: CT HEAD FINDINGS  Mild paranasal sinus mucosal thickening. Mastoids and tympanic cavities are clear. No acute osseous abnormality identified. Visualized orbits and scalp soft tissues are within normal limits.  Mild streak artifact through the posterior fossa and posterior hemispheres. Normal cerebral volume. No midline shift, ventriculomegaly, mass effect, evidence of mass lesion, intracranial hemorrhage or evidence of cortically based acute infarction. Gray-white matter differentiation is  within normal limits throughout the brain.  CT CERVICAL SPINE FINDINGS  Reversal of cervical lordosis. Visualized skull base is intact. No atlanto-occipital dissociation. Cervicothoracic junction alignment is within normal limits. Bilateral posterior element alignment is within normal limits. Cervical disc space loss and endplate spurring from C4-C5 to C6-C7. No definite spinal stenosis. No acute cervical spine fracture. Negative lung apices. Grossly intact visualized upper thoracic levels.  Negative visualized noncontrast paraspinal soft tissues.  IMPRESSION: 1.  Normal noncontrast CT appearance of the brain. 2. No acute fracture or listhesis identified in the cervical spine. Ligamentous injury is not excluded. 3. C4-C5 through C6-C7 chronic disc and endplate degeneration. 4. Mild paranasal sinus inflammation.   Electronically Signed   By: Odessa Fleming M.D.   On: 12/02/2014 12:41   Ct Thoracic Spine Wo Contrast  12/02/2014   CLINICAL DATA:  LEFT-sided weakness. LEFT-sided cervicalgia/neck pain and chest pain. Radiations to the back. Ten out of 10 pain.  EXAM: CT THORACIC SPINE WITHOUT CONTRAST  TECHNIQUE: Multidetector CT imaging of the thoracic spine was performed without intravenous contrast administration. Multiplanar CT image reconstructions were also generated.  COMPARISON:  Chest CT 12/01/2014.  FINDINGS: Thoracic spinal alignment demonstrates a dextroconvex curve with the apex at T6-T7. 12 thoracic type  vertebral bodies are present. Mild thoracic spondylosis is present. T11-T12 degenerative disc disease with degenerative endplate changes. There is no bony central stenosis. No destructive osseous lesions. No bony central or foraminal stenosis.  IMPRESSION: Mild thoracic spondylosis.  No acute abnormality.   Electronically Signed   By: Andreas Newport M.D.   On: 12/02/2014 13:36   US Venous Img Upper Uni Right  12/04/2014   CLINICAL DATA:  44 year old male with swelling of the right hand and fingers  EXAM: RIGHT UPPER EXTREMITY VENOUS DOPPLER ULTRASOUND  TECHNIQUE: Gray-scale sonography with graded compression, as well as color Doppler and duplex ultrasound were performed to evaluate the upper extremity deep venous system from the level of the subclavian vein and including the jugular, axillary, basilic, radial, ulnar and upper cephalic vein. Spectral Doppler was utilized to evaluate flow at rest and with distal augmentation maneuvers.  COMPARISON:  None.  FINDINGS: Contralateral Subclavian Vein: Respiratory phasicity is normal and symmetric with the symptomatic side. No evidence of thrombus. Normal compressibility.  Internal Jugular Vein: No evidence of thrombus. Normal compressibility, respiratory phasicity and response to augmentation.  Subclavian Vein: No evidence of thrombus. Normal compressibility, respiratory phasicity and response to augmentation.  Axillary Vein: No evidence of thrombus. Normal compressibility, respiratory phasicity and response to augmentation.  Cephalic Vein: No evidence of thrombus. Normal compressibility, respiratory phasicity and response to augmentation.  Basilic Vein: No evidence of thrombus. Normal compressibility, respiratory phasicity and response to augmentation.  Brachial Veins: No evidence of thrombus. Normal compressibility, respiratory phasicity and response to augmentation.  Radial Veins: No evidence of thrombus. Normal compressibility, respiratory phasicity and response to  augmentation.  Ulnar Veins: No evidence of thrombus. Normal compressibility, respiratory phasicity and response to augmentation.  Venous Reflux:  None visualized.  Other Findings:  None visualized.  IMPRESSION: No evidence of deep venous thrombosis.   Electronically Signed   By: Malachy Moan M.D.   On: 12/04/2014 13:02   Ct Angio Chest Aorta W/cm &/or Wo/cm  12/01/2014   CLINICAL DATA:  Severe acute chest pain radiating to the back  EXAM: CT ANGIOGRAPHY CHEST WITH CONTRAST  TECHNIQUE: Multidetector CT imaging of the chest was performed using the standard protocol during bolus administration of intravenous contrast. Multiplanar  CT image reconstructions and MIPs were obtained to evaluate the vascular anatomy.  CONTRAST:  OMNIPAQUE IOHEXOL 350 MG/ML SOLN  COMPARISON:  None.  FINDINGS: There is adequate opacification of the pulmonary arteries. There is no pulmonary embolus. The main pulmonary artery, right main pulmonary artery and left main pulmonary arteries are normal in size. The heart size is normal. There is no pericardial effusion. The thoracic aorta is normal in caliber. There is no thoracic aortic dissection. There is no mediastinal hematoma.  There is right middle lobe hazy airspace disease most consistent with pneumonia.  There is no axillary, hilar, or mediastinal adenopathy.  There is no lytic or blastic osseous lesion.  The visualized portions of the upper abdomen are unremarkable.  Review of the MIP images confirms the above findings.  IMPRESSION: 1. No evidence of thoracic aortic dissection. 2. No evidence of pulmonary embolus. 3. Right middle lobe pneumonia.   Electronically Signed   By: Elige Ko   On: 12/01/2014 19:47    EKG:   Orders placed or performed during the hospital encounter of 12/01/14  . ED EKG  . ED EKG  . EKG 12-Lead  . EKG 12-Lead      Management plans discussed with the patient, family and they are in agreement.  CODE STATUS:     Code Status Orders         Start     Ordered   12/01/14 2105  Full code   Continuous     12/01/14 2104      TOTAL TIME TAKING CARE OF THIS PATIENT: Discharge plan of care discussed with the patient, Spanish interpreter Ms. Damian Leavell has facilitated the conversation- 45 minutes.    @  on 12/04/2014 at 1:09 PM  Between 7am to 6pm - Pager - [phone]  After 6pm go to [email or web address] - password [email or web address] [facility]  [facility]  Office  [phone]  CC: Primary care physician; No primary care provider on file.

## 2014-12-06 LAB — CULTURE, BLOOD (ROUTINE X 2)
Culture: NO GROWTH
Culture: NO GROWTH

## 2016-06-01 ENCOUNTER — Encounter: Payer: Self-pay | Admitting: Emergency Medicine

## 2016-06-01 ENCOUNTER — Ambulatory Visit
Admission: EM | Admit: 2016-06-01 | Discharge: 2016-06-01 | Disposition: A | Discharge status: Home / Self Care | Payer: Self-pay | Attending: Family Medicine | Admitting: Family Medicine

## 2016-06-01 DIAGNOSIS — I152 Hypertension secondary to endocrine disorders: Secondary | ICD-10-CM

## 2016-06-01 DIAGNOSIS — R0789 Other chest pain: Secondary | ICD-10-CM

## 2016-06-01 DIAGNOSIS — E0590 Thyrotoxicosis, unspecified without thyrotoxic crisis or storm: Secondary | ICD-10-CM

## 2016-06-01 DIAGNOSIS — R1084 Generalized abdominal pain: Secondary | ICD-10-CM

## 2016-06-01 MED ORDER — MELOXICAM 15 MG PO TABS: 15.0000 mg | ORAL_TABLET | Freq: Every day | ORAL | 0 refills | Status: DC

## 2016-06-01 NOTE — ED Provider Notes (Signed)
[facility]    CSN: [id number] Arrival date & time: 06/01/16  1515     History   Chief Complaint Chief Complaint  Patient presents with  . Hypertension  . Abdominal Pain    HPI Derrick Williamson is a 46 y.o. male.   Patient was brought to the urgent care by a coworker. This 30 year old Hispanic male was withering  In the urgent care lobby complaining of abdominal pain elevated blood pressure heart racing as well. He was brought back head of the people because of his sickness and illness. Initially presented the pain was excruciating 9 out of 10 and he went back from the abdominal pain that was oversew lower abdomen and chest discomfort and palpitations heart racing and his history of hyperthyroidism. Unfortunately because his English was not the best it is difficult trying to grasp understanding of the complexity of his problems as he went from one symptom to another symptom without making really good [place] reason why he was here and what was going on. As bad as the pain was essentially improved after he was here for a few minutes. That time translation service was obtained to make sure the patient didn't need to go to the hospital or to be rushed to the hospital by EMS.   The history is limited by a language barrier. A language interpreter was used.  Hypertension  This is a new problem. The current episode started less than 1 hour ago. The problem occurs constantly. The problem has not changed since onset.Associated symptoms include chest pain, abdominal pain and shortness of breath. Nothing aggravates the symptoms. Nothing relieves the symptoms. He has tried nothing for the symptoms. The treatment provided no relief.  Abdominal Pain  Pain location:  Generalized Pain radiates to:  R flank Pain severity:  Severe Onset quality:  Sudden Timing:  Constant Progression:  Resolved Chronicity:  Recurrent Associated symptoms: chest pain and shortness of breath     Past Medical  History:  Diagnosis Date  . Asthma     Patient Active Problem List   Diagnosis Date Noted  . Left sided chest pain 12/01/2014  . Community acquired pneumonia 12/01/2014    History reviewed. No pertinent surgical history.     Home Medications    Prior to Admission medications   Medication Sig Start Date End Date Taking? Authorizing Provider  meloxicam (MOBIC) 15 MG tablet Take 1 tablet (15 mg total) by mouth daily. 06/01/16   Hassan Rowan, MD    Family History Family History  Problem Relation Age of Onset  . Heart failure Neg Hx     Social History Social History  Substance Use Topics  . Smoking status: Current Every Day Smoker  . Smokeless tobacco: Never Used  . Alcohol use No     Allergies   Patient has no known allergies.   Review of Systems Review of Systems  Unable to perform ROS: Other  Respiratory: Positive for shortness of breath.   Cardiovascular: Positive for chest pain and palpitations.  Gastrointestinal: Positive for abdominal pain.  Neurological: Positive for tremors and weakness.     Physical Exam Triage Vital Signs ED Triage Vitals  Enc Vitals Group     BP 06/01/16 1534 (!) 145/72     Pulse Rate 06/01/16 1534 79     Resp 06/01/16 1534 16     Temp 06/01/16 1534 98.6 F (37 C)     Temp Source 06/01/16 1534 Oral     SpO2 06/01/16 1534  100 %     Weight 06/01/16 1531 147 lb (66.7 kg)     Height 06/01/16 1531 5\' 7"  (1.702 m)     Head Circumference --      Peak Flow --      Pain Score 06/01/16 1533 10     Pain Loc --      Pain Edu? --      Excl. in GC? --    No data found.   Updated Vital Signs BP (!) 142/63 (BP Location: Left Arm)   Pulse 78   Temp 98.6 F (37 C) (Oral)   Resp 16   Ht 5\' 7"  (1.702 m)   Wt 147 lb (66.7 kg)   SpO2 99%   BMI 23.02 kg/m   Visual Acuity Right Eye Distance:   Left Eye Distance:   Bilateral Distance:    Right Eye Near:   Left Eye Near:    Bilateral Near:     Physical Exam  Constitutional:  He is oriented to person, place, and time. He appears well-developed and well-nourished.  HENT:  Head: Normocephalic and atraumatic.  Right Ear: External ear normal.  Left Ear: External ear normal.  Nose: Nose normal.  Mouth/Throat: Oropharynx is clear and moist.  Eyes: Pupils are equal, round, and reactive to light.  Neck: Normal range of motion. Neck supple. Thyromegaly present.  Cardiovascular: Normal rate and regular rhythm.   Pulmonary/Chest: Effort normal and breath sounds normal.  Abdominal: Soft. There is tenderness.  Musculoskeletal: Normal range of motion.  Neurological: He is alert and oriented to person, place, and time.  Skin: Skin is warm.  Vitals reviewed.    UC Treatments / Results  Labs (all labs ordered are listed, but only abnormal results are displayed) Labs Reviewed - No data to display  EKG  EKG Interpretation None       Radiology No results found.  Procedures Procedures (including critical care time)  Medications Ordered in UC Medications - No data to display   Initial Impression / Assessment and Plan / UC Course  I have reviewed the triage vital signs and the nursing notes.  Pertinent labs & imaging results that were available during my care of the patient were reviewed by me and considered in my medical decision making (see chart for details).   during the time in the urgent care this difficulty as stated above understanding the patient translation was obtained. Offered to transfer patient to the hospital she declined explained to most concerned about the to have a CT scan. At that point time his informed me that he's spent 4 days [facility] several months ago and had multiple CT scans done. At that point review was done and it was found the patient was at [facility] in August this year. Multiple CT scans and x-rays were done he had endocrinology consult done and they recommend placing him on hypothyroid medicine follow-up which  she apparently did not do. He was treated for pneumonia with Zithromax he had cardiac evaluation done for the weakness and palpitations as well. Overall extensive workup was done at [facility] I see no evidence of patient followed up on the recommendation. It appeared that he had nothing life-threatening except concerned over that persistent hypodense thyroidism. She also be noted that patient when he found out that there was no Dilaudid here or anything else for pain also seemed to have his pain level expectation level diminished as well and I strongly recommend follow-up with a internist  locally or PCP to get endocrinology referral and he will need to be seen by endocrinologist. For his discomfort, placed on Mobic 15 mg 1 tablet a day since she's had it by extensive workup he is agreeable no further workup no blood work to be done at this time will him go back to work tomorrow. One of his coworkers are coming to pick him up as well.  Final Clinical Impressions(s) / UC Diagnoses   Final diagnoses:  Hyperthyroidism    New Prescriptions Discharge Medication List as of 06/01/2016  4:39 PM    START taking these medications   Details  meloxicam (MOBIC) 15 MG tablet Take 1 tablet (15 mg total) by mouth daily., Starting Fri 06/01/2016, Normal         Hassan RowanEugene Zayli Villafuerte, MD 06/01/16 [phone]

## 2016-06-01 NOTE — ED Triage Notes (Signed)
Patient c/o abdominal pain, palpitations, and elevated blood pressure that started this morning.

## 2018-05-28 ENCOUNTER — Emergency Department
Admission: EM | Admit: 2018-05-28 | Discharge: 2018-05-28 | Disposition: A | Discharge status: Home / Self Care | Payer: BLUE CROSS/BLUE SHIELD | Attending: Emergency Medicine | Admitting: Emergency Medicine

## 2018-05-28 ENCOUNTER — Encounter: Payer: Self-pay | Admitting: Emergency Medicine

## 2018-05-28 ENCOUNTER — Other Ambulatory Visit: Payer: Self-pay

## 2018-05-28 DIAGNOSIS — J111 Influenza due to unidentified influenza virus with other respiratory manifestations: Secondary | ICD-10-CM

## 2018-05-28 DIAGNOSIS — F17210 Nicotine dependence, cigarettes, uncomplicated: Secondary | ICD-10-CM | POA: Diagnosis not present

## 2018-05-28 DIAGNOSIS — M791 Myalgia, unspecified site: Secondary | ICD-10-CM | POA: Diagnosis present

## 2018-05-28 DIAGNOSIS — J45909 Unspecified asthma, uncomplicated: Secondary | ICD-10-CM | POA: Diagnosis not present

## 2018-05-28 MED ORDER — ACETAMINOPHEN 500 MG PO TABS
1000.0000 mg | ORAL_TABLET | Freq: Once | ORAL | Status: AC
  Administered 2018-05-28: 1000 mg via ORAL
  Filled 2018-05-28: qty 2

## 2018-05-28 MED ORDER — GUAIFENESIN-CODEINE 100-10 MG/5ML PO SOLN: 5.0000 mL | Freq: Four times a day (QID) | ORAL | 0 refills | Status: DC | PRN

## 2018-05-28 NOTE — ED Provider Notes (Signed)
[facility] Emergency Department Provider Note  ____________________________________________   First MD Initiated Contact with Patient 05/28/18 1036     (approximate)  I have reviewed the triage vital signs and the nursing notes.   HISTORY  Chief Complaint Generalized Body Aches; Chills; and Cough   HPI Derrick Williamson is a 48 y.o. male presents to the ED with complaint of body aches, fever, chills, cough for approximately 3 to 3-1/2 days.  Patient states he has been taking TheraFlu over-the-counter without any relief.  He last took TheraFlu at 1 AM today.  He denies any other symptoms.  Rates his pain as 6 out of 10.    Past Medical History:  Diagnosis Date  . Asthma     Patient Active Problem List   Diagnosis Date Noted  . Left sided chest pain 12/01/2014  . Community acquired pneumonia 12/01/2014    History reviewed. No pertinent surgical history.  Prior to Admission medications   Medication Sig Start Date End Date Taking? Authorizing Provider  guaiFENesin-codeine 100-10 MG/5ML syrup Take 5 mLs by mouth every 6 (six) hours as needed. 05/28/18   Tommi Rumps, PA-C    Allergies Patient has no known allergies.  Family History  Problem Relation Age of Onset  . Heart failure Neg Hx     Social History Social History   Tobacco Use  . Smoking status: Current Every Day Smoker  . Smokeless tobacco: Never Used  Substance Use Topics  . Alcohol use: No  . Drug use: No    Review of Systems Constitutional: Positive fever/chills Eyes: No visual changes. ENT: No sore throat.  Positive nasal congestion. Cardiovascular: Denies chest pain. Respiratory: Denies shortness of breath.  Positive cough. Gastrointestinal: No abdominal pain.  No nausea, no vomiting.  No diarrhea.  No constipation. Genitourinary: Negative for dysuria. Musculoskeletal: Positive for body aches. Skin: Negative for rash. Neurological: Positive for headaches, negative for  focal weakness or numbness. ___________________________________________   PHYSICAL EXAM:  VITAL SIGNS: ED Triage Vitals  Enc Vitals Group     BP 05/28/18 0956 (!) 148/87     Pulse Rate 05/28/18 0956 78     Resp 05/28/18 0956 20     Temp 05/28/18 0956 (!) 100.5 F (38.1 C)     Temp Source 05/28/18 0956 Oral     SpO2 05/28/18 0956 98 %     Weight 05/28/18 1007 163 lb (73.9 kg)     Height 05/28/18 1007 5\' 7"  (1.702 m)     Head Circumference --      Peak Flow --      Pain Score 05/28/18 1007 6     Pain Loc --      Pain Edu? --      Excl. in GC? --     Constitutional: Alert and oriented. Well appearing and in no acute distress. Eyes: Conjunctivae are normal. Head: Atraumatic. Nose: Mild congestion/rhinnorhea.  TMs are dull bilaterally. Mouth/Throat: Mucous membranes are moist.  Oropharynx non-erythematous. Neck: No stridor.   Hematological/Lymphatic/Immunilogical: No cervical lymphadenopathy. Cardiovascular: Normal rate, regular rhythm. Grossly normal heart sounds.  Good peripheral circulation. Respiratory: Normal respiratory effort.  No retractions. Lungs CTAB. Gastrointestinal: Soft and nontender. No distention.  Musculoskeletal: No lower extremity tenderness nor edema.  Neurologic:  Normal speech and language. No gross focal neurologic deficits are appreciated.  Skin:  Skin is warm, dry and intact.  Psychiatric: Mood and affect are normal. Speech and behavior are normal.  ____________________________________________   LABS (  all labs ordered are listed, but only abnormal results are displayed)  Labs Reviewed - No data to display   PROCEDURES  Procedure(s) performed: None  Procedures  Critical Care performed: No  ____________________________________________   INITIAL IMPRESSION / ASSESSMENT AND PLAN / ED COURSE  As part of my medical decision making, I reviewed the following data within the electronic MEDICAL RECORD NUMBER Notes from prior ED visits and [place]  Controlled Substance Database  Patient presents to the ED with complaint of sudden onset body aches, fever, chills, cough that started Sunday which is been approximately 3 and half days.  Patient was taking over-the-counter medication without relief of his cough.  He was given Tylenol 1000 mg p.o. while in the ED.  He was also given a prescription for Robitussin-AC as needed for cough and congestion.  He was made aware that he cannot drive or operate machinery while taking this medication.  Information about influenza was printed for him as well as a work note.  ____________________________________________   FINAL CLINICAL IMPRESSION(S) / ED DIAGNOSES  Final diagnoses:  Influenza     ED Discharge Orders         Ordered    guaiFENesin-codeine 100-10 MG/5ML syrup  Every 6 hours PRN     02 /05/20 1055           Note:  This document was prepared using Dragon voice recognition software and may include unintentional dictation errors.    Tommi Rumps, PA-C 05/28/18 1219    Nita Sickle, MD 05/28/18 [phone]

## 2018-05-28 NOTE — Discharge Instructions (Addendum)
Follow-up with your primary care provider or can no clinic acute care if any continued problems.  Continue taking Tylenol or ibuprofen as needed for body aches, headache, fever.  Take Robitussin-AC for cough and congestion.  This medication has a narcotic and it which will help decrease the coughing.  You should not drive or operate machinery while taking this medication.  Increase fluids.  Avoid being around other people as this is contagious.

## 2018-05-28 NOTE — ED Triage Notes (Signed)
Patient complaining of body aches, fever, chills, and cough.  Sx started on Monday.  Denies productive cough.  Wearing adult face mask.

## 2018-07-13 ENCOUNTER — Other Ambulatory Visit: Payer: Self-pay

## 2018-07-13 ENCOUNTER — Emergency Department
Admission: EM | Admit: 2018-07-13 | Discharge: 2018-07-13 | Disposition: A | Discharge status: Home / Self Care | Payer: BLUE CROSS/BLUE SHIELD | Attending: Emergency Medicine | Admitting: Emergency Medicine

## 2018-07-13 ENCOUNTER — Encounter: Payer: Self-pay | Admitting: Emergency Medicine

## 2018-07-13 DIAGNOSIS — Y9389 Activity, other specified: Secondary | ICD-10-CM | POA: Diagnosis not present

## 2018-07-13 DIAGNOSIS — F17200 Nicotine dependence, unspecified, uncomplicated: Secondary | ICD-10-CM | POA: Insufficient documentation

## 2018-07-13 DIAGNOSIS — Y999 Unspecified external cause status: Secondary | ICD-10-CM | POA: Diagnosis not present

## 2018-07-13 DIAGNOSIS — J45909 Unspecified asthma, uncomplicated: Secondary | ICD-10-CM | POA: Insufficient documentation

## 2018-07-13 DIAGNOSIS — S39012A Strain of muscle, fascia and tendon of lower back, initial encounter: Secondary | ICD-10-CM | POA: Diagnosis not present

## 2018-07-13 DIAGNOSIS — M62830 Muscle spasm of back: Secondary | ICD-10-CM

## 2018-07-13 DIAGNOSIS — S3992XA Unspecified injury of lower back, initial encounter: Secondary | ICD-10-CM | POA: Diagnosis present

## 2018-07-13 DIAGNOSIS — Y929 Unspecified place or not applicable: Secondary | ICD-10-CM | POA: Insufficient documentation

## 2018-07-13 DIAGNOSIS — X509XXA Other and unspecified overexertion or strenuous movements or postures, initial encounter: Secondary | ICD-10-CM | POA: Insufficient documentation

## 2018-07-13 LAB — URINALYSIS, COMPLETE (UACMP) WITH MICROSCOPIC
BILIRUBIN URINE: NEGATIVE
Bacteria, UA: NONE SEEN
Glucose, UA: NEGATIVE mg/dL
KETONES UR: NEGATIVE mg/dL
LEUKOCYTE UA: NEGATIVE
NITRITE: NEGATIVE
Protein, ur: NEGATIVE mg/dL
SPECIFIC GRAVITY, URINE: 1.02 (ref 1.005–1.030)
SQUAMOUS EPITHELIAL / LPF: NONE SEEN (ref 0–5)
pH: 6 (ref 5.0–8.0)

## 2018-07-13 MED ORDER — CYCLOBENZAPRINE HCL 5 MG PO TABS: 5.0000 mg | ORAL_TABLET | Freq: Three times a day (TID) | ORAL | 0 refills | Status: DC | PRN

## 2018-07-13 MED ORDER — KETOROLAC TROMETHAMINE 10 MG PO TABS: 10.0000 mg | ORAL_TABLET | Freq: Three times a day (TID) | ORAL | 0 refills | Status: DC

## 2018-07-13 MED ORDER — ORPHENADRINE CITRATE 30 MG/ML IJ SOLN
60.0000 mg | INTRAMUSCULAR | Status: AC
  Administered 2018-07-13: 60 mg via INTRAMUSCULAR
  Filled 2018-07-13: qty 2

## 2018-07-13 MED ORDER — OXYCODONE-ACETAMINOPHEN 5-325 MG PO TABS
1.0000 | ORAL_TABLET | Freq: Once | ORAL | Status: AC
  Administered 2018-07-13: 1 via ORAL
  Filled 2018-07-13: qty 1

## 2018-07-13 MED ORDER — HYDROCODONE-ACETAMINOPHEN 5-325 MG PO TABS: 1.0000 | ORAL_TABLET | Freq: Three times a day (TID) | ORAL | 0 refills | Status: AC | PRN

## 2018-07-13 MED ORDER — KETOROLAC TROMETHAMINE 30 MG/ML IJ SOLN
30.0000 mg | Freq: Once | INTRAMUSCULAR | Status: AC
Start: 2018-07-13 — End: 2018-07-13
  Administered 2018-07-13: 30 mg via INTRAMUSCULAR
  Filled 2018-07-13: qty 1

## 2018-07-13 NOTE — Discharge Instructions (Signed)
Your exam and labs appear to show muscle spasm and strain. Take the prescription meds as directed. Apply ice compresses or moist heat compresses to promote healing. Follow-up with a local community clinic for ongoing symptoms.

## 2018-07-13 NOTE — ED Triage Notes (Signed)
Pt c/o mid back pain from possible work. Pt states felt when he woke this am. Pt appears uncomfortable.

## 2018-07-13 NOTE — ED Notes (Signed)
Pt verbalizes d/c understanding, follow up, and RX. Pt ambulatory, VSS . Pt unable to sign due to signature pad malfnx

## 2018-07-14 NOTE — ED Provider Notes (Signed)
[facility] Emergency Department Provider Note ____________________________________________  Time seen: 2119  I have reviewed the triage vital signs and the nursing notes.  HISTORY  Chief Complaint  Back Pain  HPI Derrick Williamson is a 48 y.o. male presents to the ED for evaluation of onset of thoracolumbar back pain, with onset this morning.  Patient has been working as per usual doing physical labor.  He denies any known injury, trauma, or fall.  He describes pain with attempts to change positions for bent over.  He denies any chest pain, shortness of breath, nausea, vomiting, dizziness.  Also denies any flank pain, dysuria, hematuria, or abdominal pain.  Patient is without paresthesias, saddle anesthesias, foot drop, or incontinence.  He has been taking over-the-counter ibuprofen with limited benefit.  He denies any history of chronic or ongoing low back pain.  Past Medical History:  Diagnosis Date  . Asthma     Patient Active Problem List   Diagnosis Date Noted  . Left sided chest pain 12/01/2014  . Community acquired pneumonia 12/01/2014    History reviewed. No pertinent surgical history.  Prior to Admission medications   Medication Sig Start Date End Date Taking? Authorizing Provider  cyclobenzaprine (FLEXERIL) 5 MG tablet Take 1 tablet (5 mg total) by mouth 3 (three) times daily as needed for muscle spasms. 07/13/18   Bryonna Sundby, Charlesetta Ivory, PA-C  guaiFENesin-codeine 100-10 MG/5ML syrup Take 5 mLs by mouth every 6 (six) hours as needed. 05/28/18   Tommi Rumps, PA-C  HYDROcodone-acetaminophen (NORCO) 5-325 MG tablet Take 1 tablet by mouth 3 (three) times daily as needed for up to 3 days. 07/13/18 07/16/18  Daemon Dowty, Charlesetta Ivory, PA-C  ketorolac (TORADOL) 10 MG tablet Take 1 tablet (10 mg total) by mouth every 8 (eight) hours. 07/13/18   Cai Anfinson, Charlesetta Ivory, PA-C    Allergies Patient has no known allergies.  Family History  Problem Relation Age  of Onset  . Heart failure Neg Hx     Social History Social History   Tobacco Use  . Smoking status: Current Every Day Smoker  . Smokeless tobacco: Never Used  Substance Use Topics  . Alcohol use: No  . Drug use: No    Review of Systems  Constitutional: Negative for fever. Cardiovascular: Negative for chest pain. Respiratory: Negative for shortness of breath. Gastrointestinal: Negative for abdominal pain, vomiting and diarrhea. Genitourinary: Negative for dysuria. Musculoskeletal: Positive for back pain. Skin: Negative for rash. Neurological: Negative for headaches, focal weakness or numbness. ____________________________________________  PHYSICAL EXAM:  VITAL SIGNS: ED Triage Vitals  Enc Vitals Group     BP 07/13/18 2106 (!) 148/71     Pulse Rate 07/13/18 2106 78     Resp 07/13/18 2106 16     Temp 07/13/18 2106 98.3 F (36.8 C)     Temp Source 07/13/18 2106 Oral     SpO2 07/13/18 2106 100 %     Weight --      Height --      Head Circumference --      Peak Flow --      Pain Score 07/13/18 2105 10     Pain Loc --      Pain Edu? --      Excl. in GC? --     Constitutional: Alert and oriented. Well appearing and in no distress. Head: Normocephalic and atraumatic. Eyes: Conjunctivae are normal. Normal extraocular movements Cardiovascular: Normal rate, regular rhythm. Normal distal pulses. Respiratory: Normal  respiratory effort. No wheezes/rales/rhonchi. Gastrointestinal: Soft and nontender. No distention, rebound, guarding, or rigidity.  Normal bowel sounds noted.  No CVA tenderness elicited. Musculoskeletal: Normal spinal alignment without midline tenderness, spasm, deformity, or step-off.  Patient is primarily tender to palpation to the left thoracolumbar region.  He is able to transition slowly from supine to sit.  He is able demonstrate normal lumbar extension and flexion range.  Nontender with normal range of motion in all extremities.  Neurologic: Cranial  nerves II through XII grossly intact.  Normal LE DTRs bilaterally.  Negative seated straight leg raise.  Normal toe dorsiflexion foot eversion on exam.  Normal gait without ataxia. Normal speech and language. No gross focal neurologic deficits are appreciated. Skin:  Skin is warm, dry and intact. No rash noted. Psychiatric: Mood and affect are normal. Patient exhibits appropriate insight and judgment. ____________________________________________   LABS (pertinent positives/negatives) Labs Reviewed  URINALYSIS, COMPLETE (UACMP) WITH MICROSCOPIC - Abnormal; Notable for the following components:      Result Value   Color, Urine YELLOW (*)    APPearance CLEAR (*)    Hgb urine dipstick SMALL (*)    All other components within normal limits  ____________________________________________  PROCEDURES  Procedures Toradol 30 mg IM Norflex 60 mg IM Percocet 5-325 mg PO ____________________________________________  INITIAL IMPRESSION / ASSESSMENT AND PLAN / ED COURSE  Patient with ED evaluation of sudden onset of left greater than right thoracolumbar muscle pain.  Patient's clinical exam is consistent with a likely muscle strain and spasm.  His exam is reassuring at this time, as he is neurologically intact without any signs of spinal cord compression, saddle anesthesia, or radiculopathy.  Patient's urinalysis is also negative at this time for an indication of an infectious or obstructive GU process.  He will be treated with anti-inflammatories, muscle relaxants, and a few pain pills for this presumed musculoskeletal strain.  He is given follow-up instructions, and is encouraged to remain out of work for the remainder of this week.  He should follow-up with a low community clinic or return to the ED for worsening symptoms as discussed.  I reviewed the patient's prescription history over the last 12 months in the multi-state controlled substances database(s) that includes [place], [place], [place],  [place], [place], [place], [place], [place], [place], [place], [place], [place], [place], and [place].  Results were notable for no current prescriptions.  ____________________________________________  FINAL CLINICAL IMPRESSION(S) / ED DIAGNOSES  Final diagnoses:  Muscle spasm of back  Strain of lumbar region, initial encounter      Lissa Hoard, PA-C 07/14/18 1540    Sharman Cheek, MD 07/25/18 1649

## 2019-07-24 ENCOUNTER — Other Ambulatory Visit: Payer: Self-pay

## 2019-07-24 ENCOUNTER — Emergency Department: Payer: BC Managed Care – PPO

## 2019-07-24 ENCOUNTER — Emergency Department
Admission: EM | Admit: 2019-07-24 | Discharge: 2019-07-24 | Disposition: A | Discharge status: Home / Self Care | Payer: BC Managed Care – PPO | Attending: Emergency Medicine | Admitting: Emergency Medicine

## 2019-07-24 DIAGNOSIS — R0602 Shortness of breath: Secondary | ICD-10-CM | POA: Diagnosis present

## 2019-07-24 DIAGNOSIS — Z20822 Contact with and (suspected) exposure to covid-19: Secondary | ICD-10-CM | POA: Insufficient documentation

## 2019-07-24 DIAGNOSIS — F17200 Nicotine dependence, unspecified, uncomplicated: Secondary | ICD-10-CM | POA: Insufficient documentation

## 2019-07-24 DIAGNOSIS — J45909 Unspecified asthma, uncomplicated: Secondary | ICD-10-CM | POA: Insufficient documentation

## 2019-07-24 LAB — BASIC METABOLIC PANEL
Anion gap: 10 (ref 5–15)
BUN: 14 mg/dL (ref 6–20)
CO2: 27 mmol/L (ref 22–32)
Calcium: 9 mg/dL (ref 8.9–10.3)
Chloride: 99 mmol/L (ref 98–111)
Creatinine, Ser: 0.7 mg/dL (ref 0.61–1.24)
GFR calc Af Amer: >60 mL/min (ref >60)
GFR calc non Af Amer: >60 mL/min (ref >60)
Glucose, Bld: 153 mg/dL — ABNORMAL HIGH (ref 70–99)
Potassium: 3.9 mmol/L (ref 3.5–5.1)
Sodium: 136 mmol/L (ref 135–145)

## 2019-07-24 LAB — CBC
HCT: 47.6 % (ref 39.0–52.0)
Hemoglobin: 16.6 g/dL (ref 13.0–17.0)
MCH: 29.8 pg (ref 26.0–34.0)
MCHC: 34.9 g/dL (ref 30.0–36.0)
MCV: 85.5 fL (ref 80.0–100.0)
Platelets: 183 10*3/uL (ref 150–400)
RBC: 5.57 MIL/uL (ref 4.22–5.81)
RDW: 12 % (ref 11.5–15.5)
WBC: 9.5 10*3/uL (ref 4.0–10.5)
nRBC: 0 % (ref 0.0–0.2)

## 2019-07-24 LAB — POC SARS CORONAVIRUS 2 AG: SARS Coronavirus 2 Ag: NEGATIVE

## 2019-07-24 MED ORDER — IPRATROPIUM-ALBUTEROL 0.5-2.5 (3) MG/3ML IN SOLN
3.0000 mL | Freq: Once | RESPIRATORY_TRACT | Status: AC
  Administered 2019-07-24: 3 mL via RESPIRATORY_TRACT
  Filled 2019-07-24: qty 3

## 2019-07-24 MED ORDER — ALBUTEROL SULFATE HFA 108 (90 BASE) MCG/ACT IN AERS: 2.0000 | INHALATION_SPRAY | Freq: Four times a day (QID) | RESPIRATORY_TRACT | 1 refills | Status: DC | PRN

## 2019-07-24 MED ORDER — PREDNISONE 20 MG PO TABS: 40.0000 mg | ORAL_TABLET | Freq: Every day | ORAL | 0 refills | Status: DC

## 2019-07-24 MED ORDER — IPRATROPIUM-ALBUTEROL 0.5-2.5 (3) MG/3ML IN SOLN
3.0000 mL | Freq: Once | RESPIRATORY_TRACT | Status: AC
Start: 2019-07-24 — End: 2019-07-24
  Administered 2019-07-24: 3 mL via RESPIRATORY_TRACT
  Filled 2019-07-24: qty 3

## 2019-07-24 MED ORDER — PREDNISONE 20 MG PO TABS
60.0000 mg | ORAL_TABLET | Freq: Once | ORAL | Status: AC
  Administered 2019-07-24: 60 mg via ORAL
  Filled 2019-07-24: qty 3

## 2019-07-24 NOTE — Discharge Instructions (Addendum)
Please seek medical attention for any high fevers, chest pain, shortness of breath, change in behavior, persistent vomiting, bloody stool or any other new or concerning symptoms.  

## 2019-07-24 NOTE — ED Triage Notes (Addendum)
FIRST NURSE NOTE- here for some abdominal pain and coughing.  Ambulatory to check in.

## 2019-07-24 NOTE — ED Notes (Addendum)
Pt given warm blanket per request, nebulizer treatment finished. Pt states "it helped but not 100%".  Pt denies further needs.

## 2019-07-24 NOTE — ED Notes (Signed)
This RN bedside, pt c/o [facility] intermitently. No c/o pain at this time, pt stated "it hurts when I cough". Pt using nebulizer. NAD noted, call bell within reach.

## 2019-07-24 NOTE — ED Provider Notes (Signed)
[facility] Emergency Department Provider Note  ____________________________________________   I have reviewed the triage vital signs and the nursing notes.   HISTORY  Chief Complaint Shortness of Breath   History limited by: Not Limited   HPI Derrick Williamson is a 49 y.o. male who presents to the emergency department today because of concerns for shortness of breath.  He states that the shortness of breath started yesterday.  Is since gradually gotten worse.  Has been accompanied by some discomfort in the right side.  He says he feels a tightness when he takes deep breaths.  He denies any fevers.  Denies any cough.  Denies any swelling to his legs. States he has a history of asthma.  Records reviewed. Per medical record review patient has a history of asthma  Past Medical History:  Diagnosis Date  . Asthma     Patient Active Problem List   Diagnosis Date Noted  . Left sided chest pain 12/01/2014  . Community acquired pneumonia 12/01/2014    History reviewed. No pertinent surgical history.  Prior to Admission medications   Medication Sig Start Date End Date Taking? Authorizing Provider  cyclobenzaprine (FLEXERIL) 5 MG tablet Take 1 tablet (5 mg total) by mouth 3 (three) times daily as needed for muscle spasms. 07/13/18   Menshew, Dannielle Karvonen, PA-C  guaiFENesin-codeine 100-10 MG/5ML syrup Take 5 mLs by mouth every 6 (six) hours as needed. 05/28/18   Johnn Hai, PA-C  ketorolac (TORADOL) 10 MG tablet Take 1 tablet (10 mg total) by mouth every 8 (eight) hours. 07/13/18   Menshew, Dannielle Karvonen, PA-C    Allergies Patient has no known allergies.  Family History  Problem Relation Age of Onset  . Heart failure Neg Hx     Social History Social History   Tobacco Use  . Smoking status: Current Every Day Smoker  . Smokeless tobacco: Never Used  Substance Use Topics  . Alcohol use: No  . Drug use: No    Review of Systems Constitutional: No  fever/chills Eyes: No visual changes. ENT: No sore throat. Cardiovascular: Positive for chest tightness. Respiratory: Positive for shortness of breath. Gastrointestinal: No abdominal pain.  No nausea, no vomiting.  No diarrhea.   Genitourinary: Negative for dysuria. Musculoskeletal: Negative for back pain. Skin: Negative for rash. Neurological: Negative for headaches, focal weakness or numbness.  ____________________________________________   PHYSICAL EXAM:  VITAL SIGNS: ED Triage Vitals  Enc Vitals Group     BP 07/24/19 1747 (!) 177/76     Pulse Rate 07/24/19 1747 (!) 111     Resp 07/24/19 1747 (!) 25     Temp 07/24/19 1747 99.9 F (37.7 C)     Temp src --      SpO2 07/24/19 1747 94 %     Weight 07/24/19 1750 168 lb (76.2 kg)     Height 07/24/19 1750 5\' 7"  (1.702 m)     Head Circumference --      Peak Flow --      Pain Score 07/24/19 1749 7    Constitutional: Alert and oriented.  Eyes: Conjunctivae are normal.  ENT      Head: Normocephalic and atraumatic.      Nose: No congestion/rhinnorhea.      Mouth/Throat: Mucous membranes are moist.      Neck: No stridor. Hematological/Lymphatic/Immunilogical: No cervical lymphadenopathy. Cardiovascular: Tachycardia. Respiratory: Tachypnea. Diffuse expiratory wheezing.  Gastrointestinal: Soft and non tender. No rebound. No guarding.  Genitourinary: Deferred Musculoskeletal:  Normal range of motion in all extremities. No lower extremity edema. Neurologic:  Normal speech and language. No gross focal neurologic deficits are appreciated.  Skin:  Skin is warm, dry and intact. No rash noted. Psychiatric: Mood and affect are normal. Speech and behavior are normal. Patient exhibits appropriate insight and judgment.  ____________________________________________    LABS (pertinent positives/negatives)  CBC wbc 9.5, hgb 16.6, plt 183 BMP wnl except glu 153 COVID negative  ____________________________________________   EKG  I,  Phineas Semen, attending physician, personally viewed and interpreted this EKG  EKG Time: 1807 Rate: 107 Rhythm: sinus tachycardia Axis: normal Intervals: qtc 416 QRS: narrow ST changes: no st elevation Impression: abnormal ekg  ____________________________________________    RADIOLOGY  CXR No active disease  ____________________________________________   PROCEDURES  Procedures  ____________________________________________   INITIAL IMPRESSION / ASSESSMENT AND PLAN / ED COURSE  Pertinent labs & imaging results that were available during my care of the patient were reviewed by me and considered in my medical decision making (see chart for details).   Patient presented to the emergency department tonight because of concern for shortness of breath. Has history of asthma. On exam patient with diffuse expiratory wheezing. Patient was given oral steroid prior to my evaluation. Was given further breathing treatments. The patient did feel better. CXR without any pneumonia. Will discharge with prescription for further steroids and inhaler.  ____________________________________________   FINAL CLINICAL IMPRESSION(S) / ED DIAGNOSES  Final diagnoses:  Uncomplicated asthma, unspecified asthma severity, unspecified whether persistent     Note: This dictation was prepared with Dragon dictation. Any transcriptional errors that result from this process are unintentional     Phineas Semen, MD 07/24/19 2311

## 2019-07-24 NOTE — ED Triage Notes (Signed)
Pt is here with SOB that started two days ago. Pt states right sided pain as well.  Pt states he smokes.  Pt has labored breathing with accessory muscle use. Pt diaphoretic.

## 2019-07-24 NOTE — ED Notes (Signed)
Pt taken to room ED 2. Pt placed on monitor at this time. RN's aware

## 2019-08-16 ENCOUNTER — Other Ambulatory Visit: Payer: Self-pay

## 2019-08-16 ENCOUNTER — Emergency Department
Admission: EM | Admit: 2019-08-16 | Discharge: 2019-08-16 | Disposition: A | Discharge status: Home / Self Care | Payer: BC Managed Care – PPO | Attending: Emergency Medicine | Admitting: Emergency Medicine

## 2019-08-16 DIAGNOSIS — J4541 Moderate persistent asthma with (acute) exacerbation: Secondary | ICD-10-CM | POA: Diagnosis not present

## 2019-08-16 DIAGNOSIS — F17200 Nicotine dependence, unspecified, uncomplicated: Secondary | ICD-10-CM | POA: Diagnosis not present

## 2019-08-16 DIAGNOSIS — R0602 Shortness of breath: Secondary | ICD-10-CM | POA: Diagnosis present

## 2019-08-16 DIAGNOSIS — Z79899 Other long term (current) drug therapy: Secondary | ICD-10-CM | POA: Diagnosis not present

## 2019-08-16 MED ORDER — IPRATROPIUM-ALBUTEROL 0.5-2.5 (3) MG/3ML IN SOLN
RESPIRATORY_TRACT | Status: AC
  Filled 2019-08-16: qty 3

## 2019-08-16 MED ORDER — IPRATROPIUM-ALBUTEROL 0.5-2.5 (3) MG/3ML IN SOLN
3.0000 mL | Freq: Once | RESPIRATORY_TRACT | Status: AC
  Administered 2019-08-16: 3 mL via RESPIRATORY_TRACT

## 2019-08-16 MED ORDER — PREDNISONE 20 MG PO TABS
60.0000 mg | ORAL_TABLET | Freq: Once | ORAL | Status: AC
  Administered 2019-08-16: 60 mg via ORAL
  Filled 2019-08-16: qty 3

## 2019-08-16 MED ORDER — PREDNISONE 10 MG PO TABS: 50.0000 mg | ORAL_TABLET | Freq: Every day | ORAL | 0 refills | Status: DC

## 2019-08-16 NOTE — ED Provider Notes (Signed)
[facility] Emergency Department Provider Note  ____________________________________________   First MD Initiated Contact with Patient 08/16/19 1713     (approximate)  I have reviewed the triage vital signs and the nursing notes.   HISTORY  Chief Complaint Shortness of Breath   HPI Derrick Williamson is a 49 y.o. male with a history of asthma presents to the emergency department for treatment and evaluation of wheezing.  He states he has "bad asthma."  Symptoms worsened today.  No relief with albuterol.  No known fever.  No known exposure to COVID-19.   Past Medical History:  Diagnosis Date  . Asthma     Patient Active Problem List   Diagnosis Date Noted  . Left sided chest pain 12/01/2014  . Community acquired pneumonia 12/01/2014    History reviewed. No pertinent surgical history.  Prior to Admission medications   Medication Sig Start Date End Date Taking? Authorizing Provider  albuterol (VENTOLIN HFA) 108 (90 Base) MCG/ACT inhaler Inhale 2 puffs into the lungs every 6 (six) hours as needed for wheezing or shortness of breath. 07/24/19   Nance Pear, MD  cyclobenzaprine (FLEXERIL) 5 MG tablet Take 1 tablet (5 mg total) by mouth 3 (three) times daily as needed for muscle spasms. 07/13/18   Menshew, Dannielle Karvonen, PA-C  guaiFENesin-codeine 100-10 MG/5ML syrup Take 5 mLs by mouth every 6 (six) hours as needed. 05/28/18   Johnn Hai, PA-C  ketorolac (TORADOL) 10 MG tablet Take 1 tablet (10 mg total) by mouth every 8 (eight) hours. 07/13/18   Menshew, Dannielle Karvonen, PA-C  predniSONE (DELTASONE) 10 MG tablet Take 5 tablets (50 mg total) by mouth daily. 08/16/19   Victorino Dike, FNP    Allergies Patient has no known allergies.  Family History  Problem Relation Age of Onset  . Heart failure Neg Hx     Social History Social History   Tobacco Use  . Smoking status: Current Every Day Smoker  . Smokeless tobacco: Never Used  Substance Use  Topics  . Alcohol use: No  . Drug use: No    Review of Systems  Constitutional: No fever/chills. Eyes: No visual changes. ENT: No sore throat. Cardiovascular: Negative for chest.  Negative for pleuritic pain.  Negative for palpitations.  Negative for leg pain. Respiratory: Positive shortness of breath. Gastrointestinal: Negative for abdominal pain.  No nausea, no vomiting.  No diarrhea.  No constipation. Genitourinary: Negative for dysuria. Musculoskeletal: Negative for back pain.  Skin: Negative for rash, lesion, wound. Neurological: Negative for headaches, focal weakness or numbness. ____________________________________________   PHYSICAL EXAM:  VITAL SIGNS: ED Triage Vitals [08/16/19 1321]  Enc Vitals Group     BP (!) 159/82     Pulse Rate 76     Resp (!) 26     Temp 99 F (37.2 C)     Temp Source Oral     SpO2 90 %     Weight 165 lb (74.8 kg)     Height 5\' 7"  (1.702 m)     Head Circumference      Peak Flow      Pain Score 0     Pain Loc      Pain Edu?      Excl. in [place]?     Constitutional: Alert and oriented.  Overall well appearing and in no acute distress.  Normal mental status. Eyes: Conjunctivae are normal. PERRL. Head: Atraumatic. Nose: No congestion/rhinnorhea. Mouth/Throat: Mucous membranes are moist.  Oropharynx non-erythematous. Tongue normal in size and color. Neck: No stridor.  No carotid bruit appreciated on exam. Hematological/Lymphatic/Immunilogical: No cervical lymphadenopathy. Cardiovascular: Normal rate, regular rhythm. Grossly normal heart sounds.  Good peripheral circulation. Respiratory: Normal respiratory effort.  No retractions.  Expiratory wheezing in the left upper lobe. Gastrointestinal: Soft and nontender. No distention. No abdominal bruits. No CVA tenderness. Genitourinary: Exam deferred. Musculoskeletal: No lower extremity tenderness.  No edema of extremities. Neurologic:  Normal speech and language. No gross focal neurologic  deficits are appreciated. Skin:  Skin is warm, dry and intact. No rash noted. Psychiatric: Mood and affect are normal. Speech and behavior are normal.  ____________________________________________   LABS (all labs ordered are listed, but only abnormal results are displayed)  Labs Reviewed - No data to display ____________________________________________  EKG   ____________________________________________  RADIOLOGY  Official radiology report(s): No results found.  ____________________________________________   PROCEDURES  Procedure(s) performed: None  Procedures  Critical Care performed: No  ____________________________________________   INITIAL IMPRESSION / ASSESSMENT AND PLAN / ED COURSE  49 year old male presenting to the emergency department for treatment and evaluation of asthma exacerbation.  Patient is very upset that he has had to wait to be seen.  While waiting, he did receive 2 DuoNeb's which have apparently decreased his wheezing and help with his shortness of breath.  He wants to leave. I did recommend that he stay and receive prednisone and an albuterol neb at least. He agreed to this, but states he is leaving afterward. ____________________________________________  Differential diagnosis includes, but not limited to:   Asthma exacerbation, pneumonia, COVID-19  FINAL CLINICAL IMPRESSION(S) / ED DIAGNOSES  Patient discharged home with a prescription for prednisone and refill of the albuterol.  He was strongly encouraged to return to the emergency department if his wheezing or shortness of breath worsens.  Final diagnoses:  Moderate persistent asthma with exacerbation     ED Discharge Orders         Ordered    predniSONE (DELTASONE) 10 MG tablet  Daily     08/16/19 1757           Derrick Williamson was evaluated in Emergency Department on 08/16/2019 for the symptoms described in the history of present illness. He was evaluated in the context of the  global COVID-19 pandemic, which necessitated consideration that the patient might be at risk for infection with the SARS-CoV-2 virus that causes COVID-19. Institutional protocols and algorithms that pertain to the evaluation of patients at risk for COVID-19 are in a state of rapid change based on information released by regulatory bodies including the CDC and federal and state organizations. These policies and algorithms were followed during the patient's care in the ED.   Note:  This document was prepared using Dragon voice recognition software and may include unintentional dictation errors.   Chinita Pester, FNP 08/16/19 2017    Chesley Noon, MD 08/17/19 [phone]

## 2019-08-16 NOTE — ED Notes (Signed)
  Pt lungs improved from diminished to wheezing after 1st duoneb - discussed with Dr Cyril Loosen and received VO for 2nd duoneb

## 2019-08-16 NOTE — ED Triage Notes (Signed)
Pt has hx of asthma - unable to speak in complete sentences d/t [facility] - pt with audible wheezing

## 2019-08-19 ENCOUNTER — Emergency Department
Admission: EM | Admit: 2019-08-19 | Discharge: 2019-08-20 | Disposition: A | Discharge status: Home / Self Care | Payer: BC Managed Care – PPO | Attending: Student in an Organized Health Care Education/Training Program | Admitting: Student in an Organized Health Care Education/Training Program

## 2019-08-19 ENCOUNTER — Other Ambulatory Visit: Payer: Self-pay

## 2019-08-19 ENCOUNTER — Encounter: Payer: Self-pay | Admitting: *Deleted

## 2019-08-19 ENCOUNTER — Emergency Department: Payer: BC Managed Care – PPO

## 2019-08-19 DIAGNOSIS — J4541 Moderate persistent asthma with (acute) exacerbation: Secondary | ICD-10-CM | POA: Diagnosis not present

## 2019-08-19 DIAGNOSIS — F17200 Nicotine dependence, unspecified, uncomplicated: Secondary | ICD-10-CM | POA: Diagnosis not present

## 2019-08-19 DIAGNOSIS — Z79899 Other long term (current) drug therapy: Secondary | ICD-10-CM | POA: Insufficient documentation

## 2019-08-19 DIAGNOSIS — R062 Wheezing: Secondary | ICD-10-CM | POA: Diagnosis present

## 2019-08-19 MED ORDER — IPRATROPIUM-ALBUTEROL 0.5-2.5 (3) MG/3ML IN SOLN
3.0000 mL | Freq: Once | RESPIRATORY_TRACT | Status: AC
  Administered 2019-08-19: 3 mL via RESPIRATORY_TRACT
  Filled 2019-08-19: qty 3

## 2019-08-19 MED ORDER — ALBUTEROL SULFATE HFA 108 (90 BASE) MCG/ACT IN AERS
2.0000 | INHALATION_SPRAY | Freq: Four times a day (QID) | RESPIRATORY_TRACT | 1 refills | Status: DC | PRN
Start: 2019-08-19 — End: 2020-06-24

## 2019-08-19 MED ORDER — ALBUTEROL SULFATE (2.5 MG/3ML) 0.083% IN NEBU
2.5000 mg | INHALATION_SOLUTION | Freq: Once | RESPIRATORY_TRACT | Status: AC
  Administered 2019-08-19: 2.5 mg via RESPIRATORY_TRACT
  Filled 2019-08-19: qty 3

## 2019-08-19 MED ORDER — ALBUTEROL SULFATE HFA 108 (90 BASE) MCG/ACT IN AERS: 1.0000 | INHALATION_SPRAY | Freq: Once | RESPIRATORY_TRACT | Status: DC

## 2019-08-19 MED ORDER — DEXAMETHASONE 10 MG/ML FOR PEDIATRIC ORAL USE
10.0000 mg | Freq: Once | INTRAMUSCULAR | Status: AC
  Administered 2019-08-19: 22:00:00 10 mg via ORAL
  Filled 2019-08-19: qty 1

## 2019-08-19 MED ORDER — IPRATROPIUM-ALBUTEROL 0.5-2.5 (3) MG/3ML IN SOLN
3.0000 mL | Freq: Once | RESPIRATORY_TRACT | Status: AC
  Administered 2019-08-19: 23:00:00 3 mL via RESPIRATORY_TRACT
  Filled 2019-08-19: qty 3

## 2019-08-19 NOTE — ED Triage Notes (Signed)
Pt is wheezing.  Hx asthma.  Out of meds for several days. Pt alert

## 2019-08-19 NOTE — ED Provider Notes (Signed)
[facility] Emergency Department Provider Note  ____________________________________________  Time seen: Approximately 10:30 PM  I have reviewed the triage vital signs and the nursing notes.   HISTORY  Chief Complaint Wheezing    HPI Yonael Tulloch is a 49 y.o. male PMH asthma that presents to the emergency department for evaluation of wheezing and shortness of breath tonight.  Patient feels that he is having an asthma attack.  He does not have an albuterol inhaler.  He was evaluated in this emergency department 2 days ago for similar symptoms and he thought they were planning to give him an inhaler while he was here but he was not given one.  No recent illness.  No fever, nasal congestion, chest pain, vomiting, abdominal pain.   Past Medical History:  Diagnosis Date  . Asthma     Patient Active Problem List   Diagnosis Date Noted  . Left sided chest pain 12/01/2014  . Community acquired pneumonia 12/01/2014    No past surgical history on file.  Prior to Admission medications   Medication Sig Start Date End Date Taking? Authorizing Provider  albuterol (VENTOLIN HFA) 108 (90 Base) MCG/ACT inhaler Inhale 2 puffs into the lungs every 6 (six) hours as needed for wheezing or shortness of breath. 07/24/19   Phineas Semen, MD  albuterol (VENTOLIN HFA) 108 (90 Base) MCG/ACT inhaler Inhale 2 puffs into the lungs every 6 (six) hours as needed for wheezing or shortness of breath. 08/19/19   Enid Derry, PA-C  cyclobenzaprine (FLEXERIL) 5 MG tablet Take 1 tablet (5 mg total) by mouth 3 (three) times daily as needed for muscle spasms. 07/13/18   Menshew, Charlesetta Ivory, PA-C  guaiFENesin-codeine 100-10 MG/5ML syrup Take 5 mLs by mouth every 6 (six) hours as needed. 05/28/18   Tommi Rumps, PA-C  ketorolac (TORADOL) 10 MG tablet Take 1 tablet (10 mg total) by mouth every 8 (eight) hours. 07/13/18   Menshew, Charlesetta Ivory, PA-C  predniSONE (DELTASONE) 10 MG tablet  Take 5 tablets (50 mg total) by mouth daily. 08/16/19   Chinita Pester, FNP    Allergies Patient has no known allergies.  Family History  Problem Relation Age of Onset  . Heart failure Neg Hx     Social History Social History   Tobacco Use  . Smoking status: Current Every Day Smoker  . Smokeless tobacco: Never Used  Substance Use Topics  . Alcohol use: No  . Drug use: No     Review of Systems  Constitutional: No fever/chills Eyes: No visual changes. No discharge. ENT: Negative for congestion and rhinorrhea. Cardiovascular: No chest pain. Respiratory: Positive for cough and shortness of breath. Gastrointestinal: No abdominal pain.  No nausea, no vomiting.  No diarrhea.  No constipation. Musculoskeletal: Negative for musculoskeletal pain. Skin: Negative for rash, abrasions, lacerations, ecchymosis. Neurological: Negative for headaches.   ____________________________________________   PHYSICAL EXAM:  VITAL SIGNS: ED Triage Vitals  Enc Vitals Group     BP 08/19/19 2125 (!) 145/68     Pulse Rate 08/19/19 2125 83     Resp 08/19/19 2125 (!) 22     Temp 08/19/19 2125 98.3 F (36.8 C)     Temp Source 08/19/19 2125 Oral     SpO2 08/19/19 2125 95 %     Weight 08/19/19 2126 168 lb (76.2 kg)     Height 08/19/19 2126 5\' 7"  (1.702 m)     Head Circumference --      Peak Flow --  Pain Score --      Pain Loc --      Pain Edu? --      Excl. in [place]? --      Constitutional: Alert and oriented. Well appearing and in no acute distress. Eyes: Conjunctivae are normal. PERRL. EOMI. No discharge. Head: Atraumatic. ENT:       Ears:       Nose: No congestion/rhinnorhea.      Mouth/Throat: Mucous membranes are moist.  Neck: No stridor.   Hematological/Lymphatic/Immunilogical: No cervical lymphadenopathy. Cardiovascular: Normal rate, regular rhythm.  Good peripheral circulation. Respiratory: Normal respiratory effort without tachypnea or retractions.  Scattered wheezes.  Good air entry to the bases with no decreased or absent breath sounds. Gastrointestinal: Bowel sounds 4 quadrants. Soft and nontender to palpation. No guarding or rigidity. No palpable masses. No distention. Musculoskeletal: Full range of motion to all extremities. No gross deformities appreciated. Neurologic:  Normal speech and language. No gross focal neurologic deficits are appreciated.  Skin:  Skin is warm, dry and intact. No rash noted. Psychiatric: Mood and affect are normal. Speech and behavior are normal. Patient exhibits appropriate insight and judgement.   ____________________________________________   LABS (all labs ordered are listed, but only abnormal results are displayed)  Labs Reviewed - No data to display ____________________________________________  EKG  SR ____________________________________________  RADIOLOGY Robinette Haines, personally viewed and evaluated these images (plain radiographs) as part of my medical decision making, as well as reviewing the written report by the radiologist.  DG Chest 2 View  Result Date: 08/19/2019 CLINICAL DATA:  Wheezing. EXAM: CHEST - 2 VIEW COMPARISON:  July 24, 2019 FINDINGS: The heart size and mediastinal contours are within normal limits. Both lungs are clear. The visualized skeletal structures are unremarkable. IMPRESSION: No active cardiopulmonary disease. Electronically Signed   By: Virgina Norfolk M.D.   On: 08/19/2019 21:56    ____________________________________________    PROCEDURES  Procedure(s) performed:    Procedures    Medications  ipratropium-albuterol (DUONEB) 0.5-2.5 (3) MG/3ML nebulizer solution 3 mL (3 mLs Nebulization Given 08/19/19 2214)  dexamethasone (DECADRON) 10 MG/ML injection for Pediatric ORAL use 10 mg (10 mg Oral Given 08/19/19 2213)  ipratropium-albuterol (DUONEB) 0.5-2.5 (3) MG/3ML nebulizer solution 3 mL (3 mLs Nebulization Given 08/19/19 2307)  albuterol (PROVENTIL) (2.5 MG/3ML)  0.083% nebulizer solution 2.5 mg (2.5 mg Nebulization Given 08/19/19 2334)     ____________________________________________   INITIAL IMPRESSION / ASSESSMENT AND PLAN / ED COURSE  Pertinent labs & imaging results that were available during my care of the patient were reviewed by me and considered in my medical decision making (see chart for details).  Review of the [place] CSRS was performed in accordance of the [place] prior to dispensing any controlled drugs.   Patient's diagnosis is consistent with asthma exacerbation. Vital signs and exam are reassuring.  When patient arrived in the emergency department, he was visibly short of breath and had wheezing to auscultation.  He was given a dose of Decadron and 2 DuoNeb treatments.  Symptoms completely resolved with 2 DuoNeb treatments.  Patient was talking on his cell phone during his final breathing treatment. Patient does not feel that he needs another treatment.  He feels better after medication.  Lungs clear to auscultation bilaterally after treatment.  Patient feels comfortable going home. Patient will be discharged home with prescriptions for albuterol.  He has a prescription for prednisone at home.  I was unable to discharge him with a an albuterol inhaler from  the emergency department, as pharmacy says that it is a hospital policy to not give patients an inhaler here.  Patient will return to the emergency department if symptoms worsen before he is able to fill his inhaler in the morning.  Patient has had several flareups of his asthma this month after not having difficulty with his asthma for several years.  He was encouraged to follow-up with primary care and a specialist.  Patient is to follow up with primary care and pulmonology as needed or otherwise directed. Patient is given ED precautions to return to the ED for any worsening or new symptoms.  Francisca Harbuck was evaluated in Emergency Department on 08/20/2019 for the symptoms described in the  history of present illness. He was evaluated in the context of the global COVID-19 pandemic, which necessitated consideration that the patient might be at risk for infection with the SARS-CoV-2 virus that causes COVID-19. Institutional protocols and algorithms that pertain to the evaluation of patients at risk for COVID-19 are in a state of rapid change based on information released by regulatory bodies including the CDC and federal and state organizations. These policies and algorithms were followed during the patient's care in the ED.   ____________________________________________  FINAL CLINICAL IMPRESSION(S) / ED DIAGNOSES  Final diagnoses:  Moderate persistent asthma with exacerbation      NEW MEDICATIONS STARTED DURING THIS VISIT:  ED Discharge Orders         Ordered    albuterol (VENTOLIN HFA) 108 (90 Base) MCG/ACT inhaler  Every 6 hours PRN     08/19/19 2356              This chart was dictated using voice recognition software/Dragon. Despite best efforts to proofread, errors can occur which can change the meaning. Any change was purely unintentional.    Enid Derry, PA-C 08/20/19 Gala Lewandowsky, MD 08/24/19 [phone]

## 2019-09-02 ENCOUNTER — Encounter: Payer: Self-pay | Admitting: Emergency Medicine

## 2019-09-02 ENCOUNTER — Inpatient Hospital Stay
Admission: EM | Admit: 2019-09-02 | Discharge: 2019-09-04 | DRG: 189 | Disposition: A | Discharge status: Home / Self Care | Payer: BC Managed Care – PPO | Attending: Hospitalist | Admitting: Hospitalist

## 2019-09-02 ENCOUNTER — Emergency Department: Payer: BC Managed Care – PPO

## 2019-09-02 DIAGNOSIS — J4551 Severe persistent asthma with (acute) exacerbation: Secondary | ICD-10-CM | POA: Diagnosis not present

## 2019-09-02 DIAGNOSIS — Z8701 Personal history of pneumonia (recurrent): Secondary | ICD-10-CM

## 2019-09-02 DIAGNOSIS — Z20822 Contact with and (suspected) exposure to covid-19: Secondary | ICD-10-CM | POA: Diagnosis present

## 2019-09-02 DIAGNOSIS — J9601 Acute respiratory failure with hypoxia: Secondary | ICD-10-CM | POA: Diagnosis present

## 2019-09-02 DIAGNOSIS — Z7952 Long term (current) use of systemic steroids: Secondary | ICD-10-CM

## 2019-09-02 DIAGNOSIS — Z79899 Other long term (current) drug therapy: Secondary | ICD-10-CM

## 2019-09-02 DIAGNOSIS — Z87891 Personal history of nicotine dependence: Secondary | ICD-10-CM

## 2019-09-02 DIAGNOSIS — J45901 Unspecified asthma with (acute) exacerbation: Secondary | ICD-10-CM

## 2019-09-02 LAB — COMPREHENSIVE METABOLIC PANEL
ALT: 34 U/L (ref 0–44)
AST: 17 U/L (ref 15–41)
Albumin: 4.3 g/dL (ref 3.5–5.0)
Alkaline Phosphatase: 80 U/L (ref 38–126)
Anion gap: 8 (ref 5–15)
BUN: 11 mg/dL (ref 6–20)
CO2: 26 mmol/L (ref 22–32)
Calcium: 8.9 mg/dL (ref 8.9–10.3)
Chloride: 107 mmol/L (ref 98–111)
Creatinine, Ser: 0.79 mg/dL (ref 0.61–1.24)
GFR calc Af Amer: >60 mL/min (ref >60)
GFR calc non Af Amer: >60 mL/min (ref >60)
Glucose, Bld: 113 mg/dL — ABNORMAL HIGH (ref 70–99)
Potassium: 4.2 mmol/L (ref 3.5–5.1)
Sodium: 141 mmol/L (ref 135–145)
Total Bilirubin: 0.6 mg/dL (ref 0.3–1.2)
Total Protein: 7.6 g/dL (ref 6.5–8.1)

## 2019-09-02 LAB — CBC
HCT: 44.1 % (ref 39.0–52.0)
Hemoglobin: 15.9 g/dL (ref 13.0–17.0)
MCH: 30.1 pg (ref 26.0–34.0)
MCHC: 36.1 g/dL — ABNORMAL HIGH (ref 30.0–36.0)
MCV: 83.5 fL (ref 80.0–100.0)
Platelets: 183 10*3/uL (ref 150–400)
RBC: 5.28 MIL/uL (ref 4.22–5.81)
RDW: 12.1 % (ref 11.5–15.5)
WBC: 8.2 10*3/uL (ref 4.0–10.5)
nRBC: 0 % (ref 0.0–0.2)

## 2019-09-02 LAB — SARS CORONAVIRUS 2 BY RT PCR (HOSPITAL ORDER, PERFORMED IN ~~LOC~~ HOSPITAL LAB): SARS Coronavirus 2: NEGATIVE

## 2019-09-02 LAB — POC SARS CORONAVIRUS 2 AG: SARS Coronavirus 2 Ag: NEGATIVE

## 2019-09-02 MED ORDER — ONDANSETRON HCL 4 MG/2ML IJ SOLN: 4.0000 mg | Freq: Four times a day (QID) | INTRAMUSCULAR | Status: DC | PRN

## 2019-09-02 MED ORDER — SODIUM CHLORIDE 0.9% FLUSH: 3.0000 mL | Freq: Two times a day (BID) | INTRAVENOUS | Status: DC

## 2019-09-02 MED ORDER — IPRATROPIUM-ALBUTEROL 0.5-2.5 (3) MG/3ML IN SOLN
3.0000 mL | Freq: Four times a day (QID) | RESPIRATORY_TRACT | Status: DC
  Administered 2019-09-02 – 2019-09-03 (×3): 3 mL via RESPIRATORY_TRACT
  Filled 2019-09-02 (×3): qty 3

## 2019-09-02 MED ORDER — ACETAMINOPHEN 650 MG RE SUPP: 650.0000 mg | Freq: Four times a day (QID) | RECTAL | Status: DC | PRN

## 2019-09-02 MED ORDER — METHYLPREDNISOLONE SODIUM SUCC 40 MG IJ SOLR
40.0000 mg | Freq: Two times a day (BID) | INTRAMUSCULAR | Status: DC
  Administered 2019-09-03 – 2019-09-04 (×3): 40 mg via INTRAVENOUS
  Filled 2019-09-02 (×3): qty 1

## 2019-09-02 MED ORDER — ALBUTEROL (5 MG/ML) CONTINUOUS INHALATION SOLN
10.0000 mg/h | INHALATION_SOLUTION | RESPIRATORY_TRACT | Status: AC
  Administered 2019-09-02: 10 mg/h via RESPIRATORY_TRACT
  Filled 2019-09-02: qty 20

## 2019-09-02 MED ORDER — ACETAMINOPHEN 325 MG PO TABS: 650.0000 mg | ORAL_TABLET | Freq: Four times a day (QID) | ORAL | Status: DC | PRN

## 2019-09-02 MED ORDER — ENOXAPARIN SODIUM 40 MG/0.4ML ~~LOC~~ SOLN
40.0000 mg | SUBCUTANEOUS | Status: DC
  Administered 2019-09-03 – 2019-09-04 (×2): 40 mg via SUBCUTANEOUS
  Filled 2019-09-02 (×2): qty 0.4

## 2019-09-02 MED ORDER — ONDANSETRON HCL 4 MG PO TABS: 4.0000 mg | ORAL_TABLET | Freq: Four times a day (QID) | ORAL | Status: DC | PRN

## 2019-09-02 MED ORDER — IPRATROPIUM-ALBUTEROL 0.5-2.5 (3) MG/3ML IN SOLN
3.0000 mL | Freq: Once | RESPIRATORY_TRACT | Status: AC
  Administered 2019-09-02: 3 mL via RESPIRATORY_TRACT

## 2019-09-02 MED ORDER — MAGNESIUM SULFATE 2 GM/50ML IV SOLN
2.0000 g | Freq: Once | INTRAVENOUS | Status: AC
  Administered 2019-09-02: 2 g via INTRAVENOUS
  Filled 2019-09-02: qty 50

## 2019-09-02 MED ORDER — METHYLPREDNISOLONE SODIUM SUCC 125 MG IJ SOLR
125.0000 mg | INTRAMUSCULAR | Status: AC
  Administered 2019-09-02: 19:00:00 125 mg via INTRAVENOUS
  Filled 2019-09-02: qty 2

## 2019-09-02 MED ORDER — ALBUTEROL SULFATE (2.5 MG/3ML) 0.083% IN NEBU
2.5000 mg | INHALATION_SOLUTION | RESPIRATORY_TRACT | Status: DC | PRN
  Administered 2019-09-03 (×3): 2.5 mg via RESPIRATORY_TRACT
  Filled 2019-09-02 (×3): qty 3

## 2019-09-02 NOTE — ED Provider Notes (Signed)
[facility] Emergency Department Provider Note  ____________________________________________   First MD Initiated Contact with Patient 09/02/19 1917     (approximate)  I have reviewed the triage vital signs and the nursing notes.   HISTORY  Chief Complaint Asthma  EM caveat severe dyspnea  HPI Damonta Cossey is a 49 y.o. male reports "asthma" only able to speak about 1 word at a time.  Patient point towards his chest reports that he is extremely short of breath due to "asthma"  Denies pain.  Positive for very short of breath.  Feels tight.   Past Medical History:  Diagnosis Date  . Asthma     Patient Active Problem List   Diagnosis Date Noted  . Acute respiratory failure with hypoxia (HCC) 09/02/2019  . Acute asthma exacerbation 09/02/2019  . Left sided chest pain 12/01/2014  . Community acquired pneumonia 12/01/2014    History reviewed. No pertinent surgical history.  Prior to Admission medications   Medication Sig Start Date End Date Taking? Authorizing Provider  albuterol (VENTOLIN HFA) 108 (90 Base) MCG/ACT inhaler Inhale 2 puffs into the lungs every 6 (six) hours as needed for wheezing or shortness of breath. 07/24/19   Phineas Semen, MD  albuterol (VENTOLIN HFA) 108 (90 Base) MCG/ACT inhaler Inhale 2 puffs into the lungs every 6 (six) hours as needed for wheezing or shortness of breath. 08/19/19   Enid Derry, PA-C  predniSONE (DELTASONE) 10 MG tablet Take 5 tablets (50 mg total) by mouth daily. 08/16/19   Chinita Pester, FNP    Allergies Patient has no known allergies.  Family History  Problem Relation Age of Onset  . Heart failure Neg Hx     Social History Social History   Tobacco Use  . Smoking status: Current Every Day Smoker  . Smokeless tobacco: Never Used  Substance Use Topics  . Alcohol use: No  . Drug use: No    Review of Systems  Denies Covid exposure.  Denies  fevers.    ____________________________________________   PHYSICAL EXAM:  VITAL SIGNS: ED Triage Vitals  Enc Vitals Group     BP 09/02/19 1910 (!) 148/93     Pulse Rate 09/02/19 1910 (!) 118     Resp 09/02/19 1910 (!) 34     Temp --      Temp src --      SpO2 09/02/19 1910 91 %     Weight 09/02/19 1917 167 lb (75.8 kg)     Height 09/02/19 1917 5\' 7"  (1.702 m)     Head Circumference --      Peak Flow --      Pain Score --      Pain Loc --      Pain Edu? --      Excl. in GC? --     Constitutional: Alert and oriented.  In severe distress sitting up tripoding. Eyes: Conjunctivae are normal. Head: Atraumatic. Nose: No congestion/rhinnorhea. Mouth/Throat: Mucous membranes are moist. Neck: No stridor.  Cardiovascular: Tachycardia.  Grossly normal heart sounds.  Normal peripheral circulation Respiratory: Tachypnea tripoding severe use of accessory muscles.  Moderate end expiratory wheezing with diminished lung sounds in the bases.  Patient appears in acute distress in extremis Gastrointestinal: Soft and nontender. No distention. Musculoskeletal: No lower extremity tenderness nor edema. Neurologic:  Normal speech and language. No gross focal neurologic deficits are appreciated.  Skin:  Skin is warm, dry and intact. No rash noted. Psychiatric: Mood and affect are anxious.  Speech and behavior are normal.  Limited to 1 word  ____________________________________________   LABS (all labs ordered are listed, but only abnormal results are displayed)  Labs Reviewed  CBC - Abnormal; Notable for the following components:      Result Value   MCHC 36.1 (*)    All other components within normal limits  COMPREHENSIVE METABOLIC PANEL - Abnormal; Notable for the following components:   Glucose, Bld 113 (*)    All other components within normal limits  SARS CORONAVIRUS 2 BY RT PCR (HOSPITAL ORDER, PERFORMED IN [facility])  HIV ANTIBODY (ROUTINE TESTING W REFLEX)  CBC   BASIC METABOLIC PANEL  POC SARS CORONAVIRUS 2 AG -  ED  POC SARS CORONAVIRUS 2 AG   ____________________________________________  EKG Reviewed inter by me at 2110 Heart rate 85 QRS 70 QTc 460 Normal sinus rhythm, usual early repolarization abnormality.  No evidence of STEMI ____________________________________________  RADIOLOGY  DG Chest Portable 1 View  Result Date: 09/02/2019 CLINICAL DATA:  Asthma attack, increased work of breathing, right-sided chest pain EXAM: PORTABLE CHEST 1 VIEW COMPARISON:  08/19/2019 FINDINGS: Single frontal view of the chest demonstrates a stable cardiac silhouette. Prominent bronchovascular markings consistent with reactive airway disease. Lungs are normally inflated. No focal consolidation, effusion, or pneumothorax. No acute bony abnormalities. IMPRESSION: 1. Bronchovascular prominence consistent with reactive airway disease. No superimposed pneumonia. Electronically Signed   By: Sharlet Salina M.D.   On: 09/02/2019 19:50    Chest x-ray reviewed, concerning for reactive airway disease ____________________________________________   PROCEDURES  Procedure(s) performed: None  Procedures  Critical Care performed: Yes, see critical care note(s) CRITICAL CARE Performed by: Sharyn Creamer   Total critical care time: 35 minutes  Critical care time was exclusive of separately billable procedures and treating other patients.  Critical care was necessary to treat or prevent imminent or life-threatening deterioration.  Critical care was time spent personally by me on the following activities: development of treatment plan with patient and/or surrogate as well as nursing, discussions with consultants, evaluation of patient's response to treatment, examination of patient, obtaining history from patient or surrogate, ordering and performing treatments and interventions, ordering and review of laboratory studies, ordering and review of radiographic studies,  pulse oximetry and re-evaluation of patient's condition.  ____________________________________________   INITIAL IMPRESSION / ASSESSMENT AND PLAN / ED COURSE  Pertinent labs & imaging results that were available during my care of the patient were reviewed by me and considered in my medical decision making (see chart for details).   Patient presents for concerns of severe respiratory distress, unresponsive to nebulizer therapy as well as steroids magnesium.  Clinical picture seems most consistent with asthma exacerbation based on his previous clinical history and examination today.  Denies acute infectious symptoms and there is no evidence of infiltrate on imaging.  Normal white count.  No chest pain.  No signs of acute coronary syndrome.  No previous history to suggest concern for acute DVT or PE at this point, clinical exam does appear consistent with reactive airway disease  Clinical Course as of Sep 01 2104  Wed Sep 02, 2019  1931 Patient's work of breathing does appear to be improving, respiratory rate down to 22.  He sitting upright, no longer tripoding, and does not appear to be tiring.  Overall improving.  Will trial high flow nasal cannula with continuous albuterol at this time, discontinue order for BiPAP as patient improving rapidly   [MQ]  2022 Admission discussed with  Dr. Posey Pronto of the hospitalist service.  Patient understanding agreeable with plan for admission.  Showing steady improvement, work of breathing improving.  Tolerating high flow nasal cannula very well.   [MQ]    Clinical Course User Index [MQ] Delman Kitten, MD     ____________________________________________   FINAL CLINICAL IMPRESSION(S) / ED DIAGNOSES  Final diagnoses:  Severe persistent asthma with exacerbation        Note:  This document was prepared using Dragon voice recognition software and may include unintentional dictation errors       Delman Kitten, MD 09/02/19 2110

## 2019-09-02 NOTE — H&P (Signed)
History and Physical    Derrick Williamson SAY:[id number] DOB: Jul 16, 1970 DOA: 09/02/2019  PCP: Patient, No Pcp Per  Patient coming from: Home  I have personally briefly reviewed patient's old medical records in [facility]  Chief Complaint: Dyspnea  HPI: Derrick Williamson is a 49 y.o. male with medical history significant for asthma who presents to the ED for evaluation of dyspnea.  Patient states around 3 PM on day of admission he had worsening shortness of breath and feeling of tightness in his lungs consistent with his previous asthma exacerbations.  He says he does not have any inhalers at home including a rescue inhaler.  He has been having a cough with deep inspiration and right-sided lower chest wall/upper abdominal muscle pain due to his frequent coughing.  He otherwise denies any subjective fevers, chills, diaphoresis, chest pain, nausea, vomiting.  He reports quitting smoking 7 days ago.  ED Course:  Initial vitals showed BP 131/92, pulse 93, RR 24, SPO2 100% on HFNC 30% FiO2.  O2 saturation was in the low 90s on room air initially per EDP.  Labs are notable for WBC 8.2, hemoglobin 15.9, platelets 183,000, sodium 141, potassium 4.2, bicarb 26, BUN 11, creatinine 0.79, LFTs within normal limits.  POC SARS-CoV-2 antigen test is negative.  SARS-CoV-2 PCR test is ordered and pending.  Portable chest x-ray shows bronchovascular prominence without focal consolidation, edema, or effusion.  Patient was given IV Solu-Medrol 125 mg, IV magnesium 2 g, duo nebs x3.  BiPAP was ordered however per EDP patient began to respond to initial treatment and currently maintaining on HFNC with continuous albuterol nebulizer treatment.  The hospitalist service was consulted to admit for further evaluation/management.  Review of Systems: All systems reviewed and are negative except as documented in history of present illness above.   Past Medical History:  Diagnosis Date  . Asthma     History reviewed.  No pertinent surgical history.  Social History:  reports that he has been smoking. He has never used smokeless tobacco. He reports that he does not drink alcohol or use drugs.  No Known Allergies  Family History  Problem Relation Age of Onset  . Heart failure Neg Hx      Prior to Admission medications   Medication Sig Start Date End Date Taking? Authorizing Provider  albuterol (VENTOLIN HFA) 108 (90 Base) MCG/ACT inhaler Inhale 2 puffs into the lungs every 6 (six) hours as needed for wheezing or shortness of breath. 07/24/19   Phineas Semen, MD  albuterol (VENTOLIN HFA) 108 (90 Base) MCG/ACT inhaler Inhale 2 puffs into the lungs every 6 (six) hours as needed for wheezing or shortness of breath. 08/19/19   Enid Derry, PA-C  cyclobenzaprine (FLEXERIL) 5 MG tablet Take 1 tablet (5 mg total) by mouth 3 (three) times daily as needed for muscle spasms. 07/13/18   Menshew, Charlesetta Ivory, PA-C  guaiFENesin-codeine 100-10 MG/5ML syrup Take 5 mLs by mouth every 6 (six) hours as needed. 05/28/18   Tommi Rumps, PA-C  ketorolac (TORADOL) 10 MG tablet Take 1 tablet (10 mg total) by mouth every 8 (eight) hours. 07/13/18   Menshew, Charlesetta Ivory, PA-C  predniSONE (DELTASONE) 10 MG tablet Take 5 tablets (50 mg total) by mouth daily. 08/16/19   Chinita Pester, FNP    Physical Exam: Vitals:   09/02/19 1910 09/02/19 1917 09/02/19 1930  BP: (!) 148/93  (!) 131/92  Pulse: (!) 118  85  Resp: (!) 34  (!) 24  SpO2: 91%  100%  Weight:  75.8 kg   Height:  5\' 7"  (1.702 m)    Constitutional: Resting supine in bed, NAD, calm, comfortable Eyes: PERRL, lids and conjunctivae normal ENMT: Mucous membranes are moist. Posterior pharynx clear of any exudate or lesions.poor dentition.  Neck: normal, supple, no masses. Respiratory: Diffuse expiratory wheezing bilaterally with cough elicited on deep inspiration. Normal respiratory effort. No accessory muscle use.  Currently on HFNC. Cardiovascular: Regular  rate and rhythm, no murmurs / rubs / gallops. No extremity edema. 2+ pedal pulses. Abdomen: no tenderness, no masses palpated. No hepatosplenomegaly. Bowel sounds positive.  Musculoskeletal: no clubbing / cyanosis. No joint deformity upper and lower extremities. Good ROM, no contractures. Normal muscle tone.  Skin: no rashes, lesions, ulcers. No induration Neurologic: CN 2-12 grossly intact. Sensation intact, Strength 5/5 in all 4.  Psychiatric: Normal judgment and insight. Alert and oriented x 3. Normal mood.   Labs on Admission: I have personally reviewed following labs and imaging studies  CBC: Recent Labs  Lab 09/02/19 1907  WBC 8.2  HGB 15.9  HCT 44.1  MCV 83.5  PLT 161   Basic Metabolic Panel: Recent Labs  Lab 09/02/19 1907  NA 141  K 4.2  CL 107  CO2 26  GLUCOSE 113*  BUN 11  CREATININE 0.79  CALCIUM 8.9   GFR: Estimated Creatinine Clearance: 107.9 mL/min (by C-G formula based on SCr of 0.79 mg/dL). Liver Function Tests: Recent Labs  Lab 09/02/19 1907  AST 17  ALT 34  ALKPHOS 80  BILITOT 0.6  PROT 7.6  ALBUMIN 4.3   No results for input(s): LIPASE, AMYLASE in the last 168 hours. No results for input(s): AMMONIA in the last 168 hours. Coagulation Profile: No results for input(s): INR, PROTIME in the last 168 hours. Cardiac Enzymes: No results for input(s): CKTOTAL, CKMB, CKMBINDEX, TROPONINI in the last 168 hours. BNP (last 3 results) No results for input(s): PROBNP in the last 8760 hours. HbA1C: No results for input(s): HGBA1C in the last 72 hours. CBG: No results for input(s): GLUCAP in the last 168 hours. Lipid Profile: No results for input(s): CHOL, HDL, LDLCALC, TRIG, CHOLHDL, LDLDIRECT in the last 72 hours. Thyroid Function Tests: No results for input(s): TSH, T4TOTAL, FREET4, T3FREE, THYROIDAB in the last 72 hours. Anemia Panel: No results for input(s): VITAMINB12, FOLATE, FERRITIN, TIBC, IRON, RETICCTPCT in the last 72 hours. Urine  analysis:    Component Value Date/Time   COLORURINE YELLOW (A) 07/13/2018 2223   APPEARANCEUR CLEAR (A) 07/13/2018 2223   LABSPEC 1.020 07/13/2018 2223   PHURINE 6.0 07/13/2018 2223   GLUCOSEU NEGATIVE 07/13/2018 2223   HGBUR SMALL (A) 07/13/2018 2223   BILIRUBINUR NEGATIVE 07/13/2018 2223   KETONESUR NEGATIVE 07/13/2018 2223   PROTEINUR NEGATIVE 07/13/2018 2223   NITRITE NEGATIVE 07/13/2018 2223   LEUKOCYTESUR NEGATIVE 07/13/2018 2223    Radiological Exams on Admission: DG Chest Portable 1 View  Result Date: 09/02/2019 CLINICAL DATA:  Asthma attack, increased work of breathing, right-sided chest pain EXAM: PORTABLE CHEST 1 VIEW COMPARISON:  08/19/2019 FINDINGS: Single frontal view of the chest demonstrates a stable cardiac silhouette. Prominent bronchovascular markings consistent with reactive airway disease. Lungs are normally inflated. No focal consolidation, effusion, or pneumothorax. No acute bony abnormalities. IMPRESSION: 1. Bronchovascular prominence consistent with reactive airway disease. No superimposed pneumonia. Electronically Signed   By: Randa Ngo M.D.   On: 09/02/2019 19:50    EKG: Not performed.  Assessment/Plan Principal Problem:   Acute respiratory  failure with hypoxia (HCC) Active Problems:   Acute asthma exacerbation  Derrick Williamson is a 49 y.o. male with medical history significant for asthma who is admitted with acute respiratory failure with hypoxia due to asthma exacerbation.  Acute respiratory failure with hypoxia due to asthma exacerbation: Responded to initial bronchodilator and steroid treatment in the ED but still requiring HFNC with diffuse expiratory wheezing on examination. -Continue high flow nasal cannula and wean as able -BiPAP as needed -Continue scheduled DuoNebs and as needed albuterol nebulizers -Continue IV Solu-Medrol 40 mg twice daily -Patient will need prescription for rescue inhaler on discharge  DVT prophylaxis: Lovenox Code  Status: Full code Family Communication: Discussed with patient, he has discussed with his significant other Disposition Plan: From home, likely discharge to home pending improvement in respiratory status and able to wean to room air Consults called: None Admission status:  Status is: Observation  The patient remains OBS appropriate and will d/c before 2 midnights.  Dispo: The patient is from: Home              Anticipated d/c is to: Home              Anticipated d/c date is: 1 day pending improvement in respiratory status and ability to wean to room air              Patient currently is not medically stable to d/c.   Darreld Mclean MD [facility]  If 7PM-7AM, please contact night-coverage [email or web address]  09/02/2019, 8:31 PM

## 2019-09-02 NOTE — ED Triage Notes (Signed)
Pt presents to ED with asthma attack. Increased work of breathing noted at this time. Unable to answer questions at this time.

## 2019-09-02 NOTE — ED Notes (Signed)
Pt placed on 2L nasal cannula att this time

## 2019-09-03 ENCOUNTER — Other Ambulatory Visit: Payer: Self-pay

## 2019-09-03 DIAGNOSIS — Z20822 Contact with and (suspected) exposure to covid-19: Secondary | ICD-10-CM | POA: Diagnosis present

## 2019-09-03 DIAGNOSIS — J9601 Acute respiratory failure with hypoxia: Secondary | ICD-10-CM | POA: Diagnosis present

## 2019-09-03 DIAGNOSIS — Z7952 Long term (current) use of systemic steroids: Secondary | ICD-10-CM | POA: Diagnosis not present

## 2019-09-03 DIAGNOSIS — Z79899 Other long term (current) drug therapy: Secondary | ICD-10-CM | POA: Diagnosis not present

## 2019-09-03 DIAGNOSIS — J45901 Unspecified asthma with (acute) exacerbation: Secondary | ICD-10-CM | POA: Diagnosis present

## 2019-09-03 DIAGNOSIS — Z8701 Personal history of pneumonia (recurrent): Secondary | ICD-10-CM | POA: Diagnosis not present

## 2019-09-03 DIAGNOSIS — Z87891 Personal history of nicotine dependence: Secondary | ICD-10-CM | POA: Diagnosis not present

## 2019-09-03 DIAGNOSIS — J4551 Severe persistent asthma with (acute) exacerbation: Secondary | ICD-10-CM | POA: Diagnosis present

## 2019-09-03 LAB — BASIC METABOLIC PANEL
Anion gap: 7 (ref 5–15)
BUN: 16 mg/dL (ref 6–20)
CO2: 22 mmol/L (ref 22–32)
Calcium: 8.8 mg/dL — ABNORMAL LOW (ref 8.9–10.3)
Chloride: 109 mmol/L (ref 98–111)
Creatinine, Ser: 0.78 mg/dL (ref 0.61–1.24)
GFR calc Af Amer: >60 mL/min (ref >60)
GFR calc non Af Amer: >60 mL/min (ref >60)
Glucose, Bld: 198 mg/dL — ABNORMAL HIGH (ref 70–99)
Potassium: 4.2 mmol/L (ref 3.5–5.1)
Sodium: 138 mmol/L (ref 135–145)

## 2019-09-03 LAB — CBC
HCT: 40 % (ref 39.0–52.0)
Hemoglobin: 14.1 g/dL (ref 13.0–17.0)
MCH: 30.2 pg (ref 26.0–34.0)
MCHC: 35.3 g/dL (ref 30.0–36.0)
MCV: 85.7 fL (ref 80.0–100.0)
Platelets: 161 10*3/uL (ref 150–400)
RBC: 4.67 MIL/uL (ref 4.22–5.81)
RDW: 12.2 % (ref 11.5–15.5)
WBC: 4.4 10*3/uL (ref 4.0–10.5)
nRBC: 0 % (ref 0.0–0.2)

## 2019-09-03 LAB — HIV ANTIBODY (ROUTINE TESTING W REFLEX): HIV Screen 4th Generation wRfx: NONREACTIVE

## 2019-09-03 MED ORDER — ALUM & MAG HYDROXIDE-SIMETH 200-200-20 MG/5ML PO SUSP
15.0000 mL | Freq: Four times a day (QID) | ORAL | Status: DC | PRN
  Administered 2019-09-03: 15 mL via ORAL
  Filled 2019-09-03: qty 30

## 2019-09-03 MED ORDER — IPRATROPIUM-ALBUTEROL 0.5-2.5 (3) MG/3ML IN SOLN
3.0000 mL | RESPIRATORY_TRACT | Status: DC
  Administered 2019-09-03 – 2019-09-04 (×6): 3 mL via RESPIRATORY_TRACT
  Filled 2019-09-03 (×6): qty 3

## 2019-09-03 MED ORDER — ACETAMINOPHEN 500 MG PO TABS
1000.0000 mg | ORAL_TABLET | Freq: Three times a day (TID) | ORAL | Status: DC | PRN
  Administered 2019-09-03: 1000 mg via ORAL
  Filled 2019-09-03: qty 2

## 2019-09-03 MED ORDER — DOCUSATE SODIUM 100 MG PO CAPS: 100.0000 mg | ORAL_CAPSULE | Freq: Two times a day (BID) | ORAL | Status: DC | PRN

## 2019-09-03 MED ORDER — GUAIFENESIN-DM 100-10 MG/5ML PO SYRP: 10.0000 mL | ORAL_SOLUTION | Freq: Four times a day (QID) | ORAL | Status: DC | PRN

## 2019-09-03 MED ORDER — CALCIUM CARBONATE ANTACID 500 MG PO CHEW
1.0000 | CHEWABLE_TABLET | Freq: Three times a day (TID) | ORAL | Status: DC | PRN
  Administered 2019-09-03: 18:00:00 200 mg via ORAL
  Filled 2019-09-03: qty 1

## 2019-09-03 MED ORDER — POLYETHYLENE GLYCOL 3350 17 G PO PACK: 17.0000 g | PACK | Freq: Two times a day (BID) | ORAL | Status: DC | PRN

## 2019-09-03 NOTE — ED Notes (Signed)
Dr. Lai at bedside

## 2019-09-03 NOTE — Progress Notes (Signed)
Pt taken off heated high flow and placed on 2lpm [place]. No shortness of breath noted or increased work of breathing. Respiratory rate 18/min sats 99%, will continue to monitor, RN notified.

## 2019-09-03 NOTE — TOC Initial Note (Addendum)
Transition of Care [facility]) - Initial/Assessment Note    Patient Details  Name: Derrick Williamson MRN: [id number] Date of Birth: 09/19/1970  [facility] CM/SW Contact:    Marina Goodell Phone Number: [phone] 09/03/2019, 2:02 PM  Clinical Narrative:                  Patient presents to the ED w/ shortness of breath.  Patient does not have a provider, with patient's permission this CSW schedule a virtual appointment for him on May 24th at 10:40AM, at [facility]. Patient is concerned about not having medication while he is waiting for his appointment. This CSW explained to the patient that he was going to be admitted and they would take care of prescribing medications for him once he discharged to hold him over until he went to see the PCP.  This CSW emphasized the importance of the patient going to his scheduled appointment to establish PCP care and continue w/ his medication.  Patient verbalized understanding, CSW is fluent in [place] and did not use a Nurse, learning disability.         Patient Goals and CMS Choice        Expected Discharge Plan and Services                                                Prior Living Arrangements/Services                       Activities of Daily Living      Permission Sought/Granted                  Emotional Assessment              Admission diagnosis:  Acute respiratory failure with hypoxia (HCC) [J96.01] Asthma exacerbation [J45.901] Patient Active Problem List   Diagnosis Date Noted  . Asthma exacerbation 09/03/2019  . Acute respiratory failure with hypoxia (HCC) 09/02/2019  . Acute asthma exacerbation 09/02/2019  . Left sided chest pain 12/01/2014  . Community acquired pneumonia 12/01/2014   PCP:  Patient, No Pcp Per Pharmacy:   [id number] [place], [place] - [address] [place] [address] Phone: [phone] Fax: [phone]  [facility] [address], [place] - [address] [place] [address] Phone: [phone] Fax: [phone]     Social Determinants of Health (SDOH) Interventions    Readmission Risk Interventions No flowsheet data found.

## 2019-09-03 NOTE — ED Notes (Signed)
Pt requesting albuterol PRN for SOB. Pt does not appear to be SOB at rest. Pt wants to keep HFNC on at this time.

## 2019-09-03 NOTE — Progress Notes (Signed)
PROGRESS NOTE    Derrick Williamson  OJJ:[id number] DOB: Sep 02, 1970 DOA: 09/02/2019 PCP: Patient, No Pcp Per    Assessment & Plan:   Principal Problem:   Acute respiratory failure with hypoxia ([place]) Active Problems:   Acute asthma exacerbation   Asthma exacerbation    Derrick Williamson is a 49 y.o. Hispanic male with medical history significant for asthma who presents to the ED for evaluation of dyspnea.  Patient states around 3 PM on day of admission he had worsening shortness of breath and feeling of tightness in his lungs consistent with his previous asthma exacerbations.  He says he does not have any inhalers at home including a rescue inhaler.  He has been having a cough with deep inspiration and right-sided lower chest wall/upper abdominal muscle pain due to his frequent coughing.  He otherwise denies any subjective fevers, chills, diaphoresis, chest pain, nausea, vomiting.  He reports quitting smoking 7 days ago.   Acute respiratory failure with hypoxia due to asthma exacerbation: Responded to initial bronchodilator and steroid treatment in the ED but still requiring HFNC with diffuse expiratory wheezing on examination. --Weaned down to 2L today. PLAN: -Continue high flow nasal cannula and wean as able -BiPAP as needed -Continue scheduled DuoNebs and as needed albuterol nebulizers -Continue IV Solu-Medrol 40 mg twice daily -Patient will need to be set up with PCP and information for assistance program.   DVT prophylaxis: Lovenox SQ Code Status: Full code  Family Communication:  Status is: inpatient Dispo:   The patient is from: home Anticipated d/c is to: home Anticipated d/c date is: 1-2 days Patient currently is not medically stable to d/c due to: Still needing 2L suppl O2.     Subjective and Interval History:  Pt reported dyspnea improved, was off BiPAP and on 2L.  No fever, chest pain, abdominal pain, N/V/D, dysuria.   Objective: Vitals:   09/03/19 1230 09/03/19 1331  09/03/19 1400 09/03/19 1451  BP: (!) 135/57 (!) 146/67 130/74 (!) 142/60  Pulse: 90 (!) 104 88 91  Resp: (!) 27 (!) 27 (!) 25 (!) 22  Temp:  98 F (36.7 C)  98.1 F (36.7 C)  TempSrc:  Oral  Oral  SpO2: 96% 97% 100% 99%  Weight:    79.4 kg  Height:    5\' 7"  (1.702 m)    Intake/Output Summary (Last 24 hours) at 09/03/2019 1815 Last data filed at 09/02/2019 2019 Gross per 24 hour  Intake 50 ml  Output --  Net 50 ml   Filed Weights   09/02/19 1917 09/03/19 1451  Weight: 75.8 kg 79.4 kg    Examination:   Constitutional: NAD, AAOx3 HEENT: conjunctivae and lids normal, EOMI CV: RRR no M,R,G. Distal pulses +2.  No cyanosis.   RESP: reduced air movement, normal respiratory effort, on 2L GI: +BS, NTND Extremities: No effusions, edema, or tenderness in BLE SKIN: warm, dry and intact Neuro: II - XII grossly intact.  Sensation intact Psych: Normal mood and affect.     Data Reviewed: I have personally reviewed following labs and imaging studies  CBC: Recent Labs  Lab 09/02/19 1907 09/03/19 0526  WBC 8.2 4.4  HGB 15.9 14.1  HCT 44.1 40.0  MCV 83.5 85.7  PLT 183 937   Basic Metabolic Panel: Recent Labs  Lab 09/02/19 1907 09/03/19 0526  NA 141 138  K 4.2 4.2  CL 107 109  CO2 26 22  GLUCOSE 113* 198*  BUN 11 16  CREATININE 0.79 0.78  CALCIUM 8.9 8.8*   GFR: Estimated Creatinine Clearance: 116.5 mL/min (by C-G formula based on SCr of 0.78 mg/dL). Liver Function Tests: Recent Labs  Lab 09/02/19 1907  AST 17  ALT 34  ALKPHOS 80  BILITOT 0.6  PROT 7.6  ALBUMIN 4.3   No results for input(s): LIPASE, AMYLASE in the last 168 hours. No results for input(s): AMMONIA in the last 168 hours. Coagulation Profile: No results for input(s): INR, PROTIME in the last 168 hours. Cardiac Enzymes: No results for input(s): CKTOTAL, CKMB, CKMBINDEX, TROPONINI in the last 168 hours. BNP (last 3 results) No results for input(s): PROBNP in the last 8760 hours. HbA1C: No  results for input(s): HGBA1C in the last 72 hours. CBG: No results for input(s): GLUCAP in the last 168 hours. Lipid Profile: No results for input(s): CHOL, HDL, LDLCALC, TRIG, CHOLHDL, LDLDIRECT in the last 72 hours. Thyroid Function Tests: No results for input(s): TSH, T4TOTAL, FREET4, T3FREE, THYROIDAB in the last 72 hours. Anemia Panel: No results for input(s): VITAMINB12, FOLATE, FERRITIN, TIBC, IRON, RETICCTPCT in the last 72 hours. Sepsis Labs: No results for input(s): PROCALCITON, LATICACIDVEN in the last 168 hours.  Recent Results (from the past 240 hour(s))  SARS Coronavirus 2 by RT PCR (hospital order, performed in [facility] lab) Nasopharyngeal Nasopharyngeal Swab     Status: None   Collection Time: 09/02/19  9:29 PM   Specimen: Nasopharyngeal Swab  Result Value Ref Range Status   SARS Coronavirus 2 NEGATIVE NEGATIVE Final    Comment: (NOTE) SARS-CoV-2 target nucleic acids are NOT DETECTED. The SARS-CoV-2 RNA is generally detectable in upper and lower respiratory specimens during the acute phase of infection. The lowest concentration of SARS-CoV-2 viral copies this assay can detect is 250 copies / mL. A negative result does not preclude SARS-CoV-2 infection and should not be used as the sole basis for treatment or other patient management decisions.  A negative result may occur with improper specimen collection / handling, submission of specimen other than nasopharyngeal swab, presence of viral mutation(s) within the areas targeted by this assay, and inadequate number of viral copies (<250 copies / mL). A negative result must be combined with clinical observations, patient history, and epidemiological information. Fact Sheet for Patients:   BoilerBrush.com.cy Fact Sheet for Healthcare Providers: https://pope.com/ This test is not yet approved or cleared  by the [place] FDA and has been authorized for  detection and/or diagnosis of SARS-CoV-2 by FDA under an Emergency Use Authorization (EUA).  This EUA will remain in effect (meaning this test can be used) for the duration of the COVID-19 declaration under Section 564(b)(1) of the Act, 21 U.S.C. section 360bbb-3(b)(1), unless the authorization is terminated or revoked sooner. Performed at [facility], [address] [place], [place], [place] [address]       Radiology Studies: DG Chest Portable 1 View  Result Date: 09/02/2019 CLINICAL DATA:  Asthma attack, increased work of breathing, right-sided chest pain EXAM: PORTABLE CHEST 1 VIEW COMPARISON:  08/19/2019 FINDINGS: Single frontal view of the chest demonstrates a stable cardiac silhouette. Prominent bronchovascular markings consistent with reactive airway disease. Lungs are normally inflated. No focal consolidation, effusion, or pneumothorax. No acute bony abnormalities. IMPRESSION: 1. Bronchovascular prominence consistent with reactive airway disease. No superimposed pneumonia. Electronically Signed   By: Sharlet Salina M.D.   On: 09/02/2019 19:50     Scheduled Meds: . enoxaparin (LOVENOX) injection  40 mg Subcutaneous Q24H  . ipratropium-albuterol  3 mL Nebulization Q4H  .  methylPREDNISolone (SOLU-MEDROL) injection  40 mg Intravenous Q12H   Continuous Infusions:   LOS: 0 days     Darlin Priestly, MD [facility] If 7PM-7AM, please contact night-coverage 09/03/2019, 6:15 PM

## 2019-09-03 NOTE — ED Notes (Signed)
Pt requesting HFNC to be put back on

## 2019-09-03 NOTE — ED Notes (Signed)
Pt eating breakfast 

## 2019-09-04 LAB — BASIC METABOLIC PANEL
Anion gap: 6 (ref 5–15)
BUN: 14 mg/dL (ref 6–20)
CO2: 26 mmol/L (ref 22–32)
Calcium: 8.9 mg/dL (ref 8.9–10.3)
Chloride: 108 mmol/L (ref 98–111)
Creatinine, Ser: 0.64 mg/dL (ref 0.61–1.24)
GFR calc Af Amer: >60 mL/min (ref >60)
GFR calc non Af Amer: >60 mL/min (ref >60)
Glucose, Bld: 148 mg/dL — ABNORMAL HIGH (ref 70–99)
Potassium: 5 mmol/L (ref 3.5–5.1)
Sodium: 140 mmol/L (ref 135–145)

## 2019-09-04 LAB — CBC
HCT: 42 % (ref 39.0–52.0)
Hemoglobin: 14.6 g/dL (ref 13.0–17.0)
MCH: 29.6 pg (ref 26.0–34.0)
MCHC: 34.8 g/dL (ref 30.0–36.0)
MCV: 85.2 fL (ref 80.0–100.0)
Platelets: 182 10*3/uL (ref 150–400)
RBC: 4.93 MIL/uL (ref 4.22–5.81)
RDW: 12.5 % (ref 11.5–15.5)
WBC: 8.9 10*3/uL (ref 4.0–10.5)
nRBC: 0 % (ref 0.0–0.2)

## 2019-09-04 LAB — MAGNESIUM: Magnesium: 1.9 mg/dL (ref 1.7–2.4)

## 2019-09-04 MED ORDER — ALBUTEROL SULFATE HFA 108 (90 BASE) MCG/ACT IN AERS
2.0000 | INHALATION_SPRAY | Freq: Four times a day (QID) | RESPIRATORY_TRACT | Status: DC
  Administered 2019-09-04 (×2): 2 via RESPIRATORY_TRACT
  Filled 2019-09-04: qty 6.7

## 2019-09-04 MED ORDER — PREDNISONE 10 MG PO TABS
ORAL_TABLET | ORAL | 0 refills | Status: DC
Start: 2019-09-04 — End: 2019-11-21

## 2019-09-04 MED ORDER — IPRATROPIUM-ALBUTEROL 0.5-2.5 (3) MG/3ML IN SOLN
3.0000 mL | Freq: Four times a day (QID) | RESPIRATORY_TRACT | Status: DC
  Administered 2019-09-04: 3 mL via RESPIRATORY_TRACT
  Filled 2019-09-04: qty 3

## 2019-09-04 MED ORDER — ACETAMINOPHEN 500 MG PO TABS: 1000.0000 mg | ORAL_TABLET | Freq: Three times a day (TID) | ORAL | 0 refills | Status: DC | PRN

## 2019-09-04 NOTE — Discharge Summary (Signed)
Physician Discharge Summary   Derrick Williamson  male DOB: Jul 01, 1970  [id number]:[id number]  PCP: Patient, No Pcp Per  Admit date: 09/02/2019 Discharge date: 09/04/2019  Admitted From: home Disposition:  home CODE STATUS: Full code  Discharge Instructions    Discharge instructions   Complete by: As directed    Your asthma flare up improved quickly with strong steroids and breathing treatment, and now you are back on room air.  Please continue to take prednisone taper as directed on your prescription.  You have a new PCP appointment with [facility] for 5/24.  Your PCP (primary care physician) can help you with getting inhalers and medications in the future.   Dr. Darlin Priestly [facility] Course:  For full details, please see H&P, progress notes, consult notes and ancillary notes.  Briefly,  Derrick Williamson a 49 y.o.Hispanic malewith medical history significant forasthma who presented to the ED for evaluation of dyspnea.Patient stated around 3 PM on day of admission he had worsening shortness of breath and feeling of tightness in his lungs consistent with his previous asthma exacerbations. He said he did not have any inhalers at home including a rescue inhaler. He reported quitting smoking 7 days ago.   Acute respiratory failure with hypoxia due to asthma exacerbation Responded to initial bronchodilator and steroid treatment in the ED but still requiring HFNC with diffuse expiratory wheezing, therefore was admitted to continue treatment with IV solumedrol and scheduled DuoNeb.  Pt Pt improved quickly, was weaned down to 2L the next day, and back on RA on the day of discharge.  Pt was discharged on prednisone taper and an albuterol inhaler.  Pt was also set up with a PCP [facility] on 5/24) prior to discharge, and can receive information for medication assistance program from his PCP.   Discharge Diagnoses:  Principal Problem:   Acute respiratory failure with hypoxia  (HCC) Active Problems:   Acute asthma exacerbation   Asthma exacerbation    Discharge Instructions:  Allergies as of 09/04/2019   No Known Allergies     Medication List    TAKE these medications   acetaminophen 500 MG tablet Commonly known as: TYLENOL Take 2 tablets (1,000 mg total) by mouth every 8 (eight) hours as needed for mild pain, moderate pain or headache.   albuterol 108 (90 Base) MCG/ACT inhaler Commonly known as: VENTOLIN HFA Inhale 2 puffs into the lungs every 6 (six) hours as needed for wheezing or shortness of breath.   predniSONE 10 MG tablet Commonly known as: DELTASONE Take 40 mg (4 tablets) once daily from 5/15 to 5/17, then 20 mg (2 tablets) once daily from 5/18 to 5/21, then 10 mg (1 tablet) from 5/22 to 5/25, then done.       Follow-up Information    [facility], NP Follow up on 09/14/2019.   Why: Appointment @ 10:40 AM Contact information: [address] [place] [address] [phone]           No Known Allergies   The results of significant diagnostics from this hospitalization (including imaging, microbiology, ancillary and laboratory) are listed below for reference.   Consultations:   Procedures/Studies: DG Chest 2 View  Result Date: 08/19/2019 CLINICAL DATA:  Wheezing. EXAM: CHEST - 2 VIEW COMPARISON:  July 24, 2019 FINDINGS: The heart size and mediastinal contours are within normal limits. Both lungs are clear. The visualized skeletal structures are unremarkable. IMPRESSION: No active cardiopulmonary disease.  Electronically Signed   By: Aram Candela M.D.   On: 08/19/2019 21:56   DG Chest Portable 1 View  Result Date: 09/02/2019 CLINICAL DATA:  Asthma attack, increased work of breathing, right-sided chest pain EXAM: PORTABLE CHEST 1 VIEW COMPARISON:  08/19/2019 FINDINGS: Single frontal view of the chest demonstrates a stable cardiac silhouette. Prominent bronchovascular markings consistent with reactive  airway disease. Lungs are normally inflated. No focal consolidation, effusion, or pneumothorax. No acute bony abnormalities. IMPRESSION: 1. Bronchovascular prominence consistent with reactive airway disease. No superimposed pneumonia. Electronically Signed   By: Sharlet Salina M.D.   On: 09/02/2019 19:50      Labs: BNP (last 3 results) No results for input(s): BNP in the last 8760 hours. Basic Metabolic Panel: Recent Labs  Lab 09/02/19 1907 09/03/19 0526 09/04/19 0458  NA 141 138 140  K 4.2 4.2 5.0  CL 107 109 108  CO2 26 22 26   GLUCOSE 113* 198* 148*  BUN 11 16 14   CREATININE 0.79 0.78 0.64  CALCIUM 8.9 8.8* 8.9  MG  --   --  1.9   Liver Function Tests: Recent Labs  Lab 09/02/19 1907  AST 17  ALT 34  ALKPHOS 80  BILITOT 0.6  PROT 7.6  ALBUMIN 4.3   No results for input(s): LIPASE, AMYLASE in the last 168 hours. No results for input(s): AMMONIA in the last 168 hours. CBC: Recent Labs  Lab 09/02/19 1907 09/03/19 0526 09/04/19 0458  WBC 8.2 4.4 8.9  HGB 15.9 14.1 14.6  HCT 44.1 40.0 42.0  MCV 83.5 85.7 85.2  PLT 183 161 182   Cardiac Enzymes: No results for input(s): CKTOTAL, CKMB, CKMBINDEX, TROPONINI in the last 168 hours. BNP: Invalid input(s): POCBNP CBG: No results for input(s): GLUCAP in the last 168 hours. D-Dimer No results for input(s): DDIMER in the last 72 hours. Hgb A1c No results for input(s): HGBA1C in the last 72 hours. Lipid Profile No results for input(s): CHOL, HDL, LDLCALC, TRIG, CHOLHDL, LDLDIRECT in the last 72 hours. Thyroid function studies No results for input(s): TSH, T4TOTAL, T3FREE, THYROIDAB in the last 72 hours.  Invalid input(s): FREET3 Anemia work up No results for input(s): VITAMINB12, FOLATE, FERRITIN, TIBC, IRON, RETICCTPCT in the last 72 hours. Urinalysis    Component Value Date/Time   COLORURINE YELLOW (A) 07/13/2018 2223   APPEARANCEUR CLEAR (A) 07/13/2018 2223   LABSPEC 1.020 07/13/2018 2223   PHURINE 6.0  07/13/2018 2223   GLUCOSEU NEGATIVE 07/13/2018 2223   HGBUR SMALL (A) 07/13/2018 2223   BILIRUBINUR NEGATIVE 07/13/2018 2223   KETONESUR NEGATIVE 07/13/2018 2223   PROTEINUR NEGATIVE 07/13/2018 2223   NITRITE NEGATIVE 07/13/2018 2223   LEUKOCYTESUR NEGATIVE 07/13/2018 2223   Sepsis Labs Invalid input(s): PROCALCITONIN,  WBC,  LACTICIDVEN Microbiology Recent Results (from the past 240 hour(s))  SARS Coronavirus 2 by RT PCR (hospital order, performed in [facility] hospital lab) Nasopharyngeal Nasopharyngeal Swab     Status: None   Collection Time: 09/02/19  9:29 PM   Specimen: Nasopharyngeal Swab  Result Value Ref Range Status   SARS Coronavirus 2 NEGATIVE NEGATIVE Final    Comment: (NOTE) SARS-CoV-2 target nucleic acids are NOT DETECTED. The SARS-CoV-2 RNA is generally detectable in upper and lower respiratory specimens during the acute phase of infection. The lowest concentration of SARS-CoV-2 viral copies this assay can detect is 250 copies / mL. A negative result does not preclude SARS-CoV-2 infection and should not be used as the sole basis for treatment or other  patient management decisions.  A negative result may occur with improper specimen collection / handling, submission of specimen other than nasopharyngeal swab, presence of viral mutation(s) within the areas targeted by this assay, and inadequate number of viral copies (<250 copies / mL). A negative result must be combined with clinical observations, patient history, and epidemiological information. Fact Sheet for Patients:   StrictlyIdeas.no Fact Sheet for Healthcare Providers: BankingDealers.co.za This test is not yet approved or cleared  by the [place] FDA and has been authorized for detection and/or diagnosis of SARS-CoV-2 by FDA under an Emergency Use Authorization (EUA).  This EUA will remain in effect (meaning this test can be used) for the duration of  the COVID-19 declaration under Section 564(b)(1) of the Act, 21 U.S.C. section 360bbb-3(b)(1), unless the authorization is terminated or revoked sooner. Performed at [facility] [address] [place], [place] [address]      Total time spend on discharging this patient, including the last patient exam, discussing the hospital stay, instructions for ongoing care as it relates to all pertinent caregivers, as well as preparing the medical discharge records, prescriptions, and/or referrals as applicable, is 35 minutes.    Enzo Bi, MD  [facility] 09/04/2019, 12:34 PM  If 7PM-7AM, please contact night-coverage

## 2019-11-19 ENCOUNTER — Encounter: Payer: Self-pay | Admitting: Emergency Medicine

## 2019-11-19 ENCOUNTER — Emergency Department: Payer: BC Managed Care – PPO

## 2019-11-19 ENCOUNTER — Other Ambulatory Visit: Payer: Self-pay

## 2019-11-19 ENCOUNTER — Inpatient Hospital Stay
Admission: EM | Admit: 2019-11-19 | Discharge: 2019-11-21 | DRG: 202 | Disposition: A | Discharge status: Home / Self Care | Payer: BC Managed Care – PPO | Attending: Internal Medicine | Admitting: Internal Medicine

## 2019-11-19 DIAGNOSIS — J9601 Acute respiratory failure with hypoxia: Secondary | ICD-10-CM | POA: Diagnosis present

## 2019-11-19 DIAGNOSIS — J45901 Unspecified asthma with (acute) exacerbation: Principal | ICD-10-CM | POA: Diagnosis present

## 2019-11-19 DIAGNOSIS — R739 Hyperglycemia, unspecified: Secondary | ICD-10-CM | POA: Diagnosis not present

## 2019-11-19 DIAGNOSIS — F17200 Nicotine dependence, unspecified, uncomplicated: Secondary | ICD-10-CM | POA: Diagnosis present

## 2019-11-19 DIAGNOSIS — T380X5A Adverse effect of glucocorticoids and synthetic analogues, initial encounter: Secondary | ICD-10-CM | POA: Diagnosis not present

## 2019-11-19 DIAGNOSIS — Z20822 Contact with and (suspected) exposure to covid-19: Secondary | ICD-10-CM | POA: Diagnosis present

## 2019-11-19 LAB — CBC WITH DIFFERENTIAL/PLATELET
Abs Immature Granulocytes: 0.03 10*3/uL (ref 0.00–0.07)
Basophils Absolute: 0.1 10*3/uL (ref 0.0–0.1)
Basophils Relative: 1 %
Eosinophils Absolute: 0.9 10*3/uL — ABNORMAL HIGH (ref 0.0–0.5)
Eosinophils Relative: 8 %
HCT: 47.9 % (ref 39.0–52.0)
Hemoglobin: 16.7 g/dL (ref 13.0–17.0)
Immature Granulocytes: 0 %
Lymphocytes Relative: 21 %
Lymphs Abs: 2.5 10*3/uL (ref 0.7–4.0)
MCH: 30.9 pg (ref 26.0–34.0)
MCHC: 34.9 g/dL (ref 30.0–36.0)
MCV: 88.7 fL (ref 80.0–100.0)
Monocytes Absolute: 1.3 10*3/uL — ABNORMAL HIGH (ref 0.1–1.0)
Monocytes Relative: 11 %
Neutro Abs: 7 10*3/uL (ref 1.7–7.7)
Neutrophils Relative %: 59 %
Platelets: 190 10*3/uL (ref 150–400)
RBC: 5.4 MIL/uL (ref 4.22–5.81)
RDW: 12.4 % (ref 11.5–15.5)
WBC: 11.7 10*3/uL — ABNORMAL HIGH (ref 4.0–10.5)
nRBC: 0 % (ref 0.0–0.2)

## 2019-11-19 LAB — BASIC METABOLIC PANEL
Anion gap: 11 (ref 5–15)
BUN: 16 mg/dL (ref 6–20)
CO2: 26 mmol/L (ref 22–32)
Calcium: 9.2 mg/dL (ref 8.9–10.3)
Chloride: 103 mmol/L (ref 98–111)
Creatinine, Ser: 0.75 mg/dL (ref 0.61–1.24)
GFR calc Af Amer: >60 mL/min (ref >60)
GFR calc non Af Amer: >60 mL/min (ref >60)
Glucose, Bld: 96 mg/dL (ref 70–99)
Potassium: 4 mmol/L (ref 3.5–5.1)
Sodium: 140 mmol/L (ref 135–145)

## 2019-11-19 MED ORDER — IPRATROPIUM-ALBUTEROL 0.5-2.5 (3) MG/3ML IN SOLN
3.0000 mL | Freq: Once | RESPIRATORY_TRACT | Status: AC
  Administered 2019-11-19: 3 mL via RESPIRATORY_TRACT
  Filled 2019-11-19: qty 3

## 2019-11-19 MED ORDER — IPRATROPIUM-ALBUTEROL 0.5-2.5 (3) MG/3ML IN SOLN
3.0000 mL | Freq: Once | RESPIRATORY_TRACT | Status: AC
  Administered 2019-11-19: 3 mL via RESPIRATORY_TRACT

## 2019-11-19 MED ORDER — METHYLPREDNISOLONE SODIUM SUCC 125 MG IJ SOLR
125.0000 mg | Freq: Once | INTRAMUSCULAR | Status: AC
  Administered 2019-11-19: 125 mg via INTRAVENOUS
  Filled 2019-11-19: qty 2

## 2019-11-19 MED ORDER — MAGNESIUM SULFATE 2 GM/50ML IV SOLN
2.0000 g | Freq: Once | INTRAVENOUS | Status: AC
  Administered 2019-11-19: 2 g via INTRAVENOUS
  Filled 2019-11-19: qty 50

## 2019-11-19 MED ORDER — ALBUTEROL SULFATE (2.5 MG/3ML) 0.083% IN NEBU
15.0000 mg/h | INHALATION_SOLUTION | Freq: Once | RESPIRATORY_TRACT | Status: AC
  Administered 2019-11-19: 15 mg/h via RESPIRATORY_TRACT
  Filled 2019-11-19: qty 3

## 2019-11-19 NOTE — ED Provider Notes (Signed)
[facility] Emergency Department Provider Note  ____________________________________________   I have reviewed the triage vital signs and the nursing notes.   HISTORY  Chief Complaint Respiratory Distress   History limited by: Not Limited   HPI Derrick Williamson is a 49 y.o. male who presents to the emergency department today because of concerns for shortness of breath.  Patient states he has a history of asthma.  Started becoming short of breath yesterday.  Is progressively gotten worse.  Tried a breathing treatment at home without any success.  This has been associated by some chest pain.  He denies any associated fevers.  It feels like his previous asthma exacerbations.  Records reviewed. Per medical record review patient has a history of multiple ER visits for asthma exacerbation.  Past Medical History:  Diagnosis Date  . Asthma     Patient Active Problem List   Diagnosis Date Noted  . Asthma exacerbation 09/03/2019  . Acute respiratory failure with hypoxia (HCC) 09/02/2019  . Acute asthma exacerbation 09/02/2019  . Left sided chest pain 12/01/2014  . Community acquired pneumonia 12/01/2014    No past surgical history on file.  Prior to Admission medications   Medication Sig Start Date End Date Taking? Authorizing Provider  acetaminophen (TYLENOL) 500 MG tablet Take 2 tablets (1,000 mg total) by mouth every 8 (eight) hours as needed for mild pain, moderate pain or headache. 09/04/19   Darlin Priestly, MD  albuterol (VENTOLIN HFA) 108 (90 Base) MCG/ACT inhaler Inhale 2 puffs into the lungs every 6 (six) hours as needed for wheezing or shortness of breath. 08/19/19   Enid Derry, PA-C  predniSONE (DELTASONE) 10 MG tablet Take 40 mg (4 tablets) once daily from 5/15 to 5/17, then 20 mg (2 tablets) once daily from 5/18 to 5/21, then 10 mg (1 tablet) from 5/22 to 5/25, then done. 09/04/19   Darlin Priestly, MD    Allergies Patient has no known allergies.  Family  History  Problem Relation Age of Onset  . Heart failure Neg Hx     Social History Social History   Tobacco Use  . Smoking status: Current Every Day Smoker  . Smokeless tobacco: Never Used  Vaping Use  . Vaping Use: Never used  Substance Use Topics  . Alcohol use: No  . Drug use: No    Review of Systems Constitutional: No fever/chills Eyes: No visual changes. ENT: No sore throat. Cardiovascular: Positive for chest pain. Respiratory: Positive for shortness of breath. Gastrointestinal: No abdominal pain.  No nausea, no vomiting.  No diarrhea.   Genitourinary: Negative for dysuria. Musculoskeletal: Negative for back pain. Skin: Negative for rash. Neurological: Negative for headaches, focal weakness or numbness.  ____________________________________________   PHYSICAL EXAM:  VITAL SIGNS: ED Triage Vitals [11/19/19 2021]  Enc Vitals Group     BP (!) 170/100     Pulse Rate (!) 126     Resp (!) 36     Temp      Temp src      SpO2 90 %   Constitutional: Alert and oriented.  Eyes: Conjunctivae are normal.  ENT      Head: Normocephalic and atraumatic.      Nose: No congestion/rhinnorhea.      Mouth/Throat: Mucous membranes are moist.      Neck: No stridor. Hematological/Lymphatic/Immunilogical: No cervical lymphadenopathy. Cardiovascular: Tachycardia, regular rhythm.  No murmurs, rubs, or gallops.  Respiratory: Respiratory distress. Increased work of breathing. Tachypnea. Diffuse expiratory wheezing with poor air  movement.  Gastrointestinal: Soft and non tender. No rebound. No guarding.  Genitourinary: Deferred Musculoskeletal: Normal range of motion in all extremities. No lower extremity edema. Neurologic:  Normal speech and language. No gross focal neurologic deficits are appreciated.  Skin:  Skin is warm, dry and intact. No rash noted. Psychiatric: Mood and affect are normal. Speech and behavior are normal. Patient exhibits appropriate insight and  judgment.  ____________________________________________    LABS (pertinent positives/negatives)  CBC wbc 11.7, hgb 16.7, plt 190 BMP wnl ____________________________________________   EKG  I, [facility] Semen, attending physician, personally viewed and interpreted this EKG  EKG Time: 2029 Rate: 98 Rhythm: sinus rhythm Axis: normal Intervals: qtc 447 QRS: narrow  ST changes: no st elevation, t wave inversion v1, v2 Impression: abnormal ekg  ____________________________________________    RADIOLOGY  CXR No acute abnormality  ____________________________________________   PROCEDURES  Procedures  ____________________________________________   INITIAL IMPRESSION / ASSESSMENT AND PLAN / ED COURSE  Pertinent labs & imaging results that were available during my care of the patient were reviewed by me and considered in my medical decision making (see chart for details).   Patient presented to the emergency department today because of concerns for shortness of breath and asthma exacerbation.  On initial exam patient was tachypneic and had increased work of breathing.  Initial breath sounds showed poor air movement diffusely with diffuse expiratory wheezing.  Patient was given DuoNeb treatments Solu-Medrol and magnesium.  On reassessment patient's breathing had greatly improved although he still had diffuse expiratory wheezing.  Will try continuous nebulizer.  Patient continues to improve do think outpatient treatment is possible.  ____________________________________________   FINAL CLINICAL IMPRESSION(S) / ED DIAGNOSES  Final diagnoses:  Exacerbation of asthma, unspecified asthma severity, unspecified whether persistent     Note: This dictation was prepared with Dragon dictation. Any transcriptional errors that result from this process are unintentional     Phineas Semen, MD 11/19/19 2312

## 2019-11-19 NOTE — ED Notes (Addendum)
Dr. Derrill Kay at the bedside; pt receiving breathing tx

## 2019-11-19 NOTE — ED Triage Notes (Signed)
Pt in WR unable to speak, reparatory distress. Pt has hx/o asthma.

## 2019-11-19 NOTE — ED Notes (Signed)
Pt arrives cc of shob, labored breathing with accessory muscle use and heavy coughing, bilateral wheezing on arrival. Pt receiving duoneb now. Work of breathing has decreased, pt still has coughing spells. Pt 10% while receiving breathing treatment.

## 2019-11-19 NOTE — ED Notes (Signed)
Pt c/o rt sided lower lobe lung pain. Pt states "I have this pain because of my smoking"  pt states he has 34 years smoking. Pt states he cannot tolerate smoking anymore, and had 1 cigarette today and 4 yesterday.

## 2019-11-20 ENCOUNTER — Encounter: Payer: Self-pay | Admitting: Internal Medicine

## 2019-11-20 DIAGNOSIS — J9601 Acute respiratory failure with hypoxia: Secondary | ICD-10-CM

## 2019-11-20 DIAGNOSIS — J4551 Severe persistent asthma with (acute) exacerbation: Secondary | ICD-10-CM

## 2019-11-20 DIAGNOSIS — F17200 Nicotine dependence, unspecified, uncomplicated: Secondary | ICD-10-CM | POA: Diagnosis present

## 2019-11-20 DIAGNOSIS — T380X5A Adverse effect of glucocorticoids and synthetic analogues, initial encounter: Secondary | ICD-10-CM | POA: Diagnosis not present

## 2019-11-20 DIAGNOSIS — J45901 Unspecified asthma with (acute) exacerbation: Secondary | ICD-10-CM | POA: Diagnosis present

## 2019-11-20 DIAGNOSIS — R739 Hyperglycemia, unspecified: Secondary | ICD-10-CM | POA: Diagnosis not present

## 2019-11-20 DIAGNOSIS — Z20822 Contact with and (suspected) exposure to covid-19: Secondary | ICD-10-CM | POA: Diagnosis present

## 2019-11-20 LAB — CBC
HCT: 43.2 % (ref 39.0–52.0)
Hemoglobin: 15.1 g/dL (ref 13.0–17.0)
MCH: 30.8 pg (ref 26.0–34.0)
MCHC: 35 g/dL (ref 30.0–36.0)
MCV: 88 fL (ref 80.0–100.0)
Platelets: 169 10*3/uL (ref 150–400)
RBC: 4.91 MIL/uL (ref 4.22–5.81)
RDW: 12.1 % (ref 11.5–15.5)
WBC: 6.5 10*3/uL (ref 4.0–10.5)
nRBC: 0 % (ref 0.0–0.2)

## 2019-11-20 LAB — BASIC METABOLIC PANEL
Anion gap: 10 (ref 5–15)
BUN: 18 mg/dL (ref 6–20)
CO2: 20 mmol/L — ABNORMAL LOW (ref 22–32)
Calcium: 9 mg/dL (ref 8.9–10.3)
Chloride: 104 mmol/L (ref 98–111)
Creatinine, Ser: 0.85 mg/dL (ref 0.61–1.24)
GFR calc Af Amer: >60 mL/min (ref >60)
GFR calc non Af Amer: >60 mL/min (ref >60)
Glucose, Bld: 238 mg/dL — ABNORMAL HIGH (ref 70–99)
Potassium: 4 mmol/L (ref 3.5–5.1)
Sodium: 134 mmol/L — ABNORMAL LOW (ref 135–145)

## 2019-11-20 LAB — SARS CORONAVIRUS 2 BY RT PCR (HOSPITAL ORDER, PERFORMED IN ~~LOC~~ HOSPITAL LAB): SARS Coronavirus 2: NEGATIVE

## 2019-11-20 LAB — CREATININE, SERUM
Creatinine, Ser: 0.91 mg/dL (ref 0.61–1.24)
GFR calc Af Amer: >60 mL/min (ref >60)
GFR calc non Af Amer: >60 mL/min (ref >60)

## 2019-11-20 LAB — HIV ANTIBODY (ROUTINE TESTING W REFLEX): HIV Screen 4th Generation wRfx: NONREACTIVE

## 2019-11-20 MED ORDER — METHYLPREDNISOLONE SODIUM SUCC 125 MG IJ SOLR
125.0000 mg | Freq: Four times a day (QID) | INTRAMUSCULAR | Status: AC
  Administered 2019-11-20 (×4): 125 mg via INTRAVENOUS
  Filled 2019-11-20 (×4): qty 2

## 2019-11-20 MED ORDER — DEXTROSE-NACL 5-0.45 % IV SOLN: INTRAVENOUS | Status: DC

## 2019-11-20 MED ORDER — IPRATROPIUM-ALBUTEROL 0.5-2.5 (3) MG/3ML IN SOLN
3.0000 mL | RESPIRATORY_TRACT | Status: AC
  Administered 2019-11-20: 3 mL via RESPIRATORY_TRACT
  Filled 2019-11-20: qty 3

## 2019-11-20 MED ORDER — IPRATROPIUM-ALBUTEROL 0.5-2.5 (3) MG/3ML IN SOLN
3.0000 mL | Freq: Four times a day (QID) | RESPIRATORY_TRACT | Status: DC
  Administered 2019-11-20 – 2019-11-21 (×5): 3 mL via RESPIRATORY_TRACT
  Filled 2019-11-20 (×7): qty 3

## 2019-11-20 MED ORDER — PANTOPRAZOLE SODIUM 20 MG PO TBEC
20.0000 mg | DELAYED_RELEASE_TABLET | Freq: Every day | ORAL | Status: DC
  Administered 2019-11-20 – 2019-11-21 (×2): 20 mg via ORAL
  Filled 2019-11-20 (×2): qty 1

## 2019-11-20 MED ORDER — ALPRAZOLAM 0.5 MG PO TABS
0.5000 mg | ORAL_TABLET | Freq: Every evening | ORAL | Status: DC | PRN
  Administered 2019-11-20: 0.5 mg via ORAL
  Filled 2019-11-20: qty 1

## 2019-11-20 MED ORDER — ALBUTEROL SULFATE (2.5 MG/3ML) 0.083% IN NEBU
10.0000 mg | INHALATION_SOLUTION | Freq: Once | RESPIRATORY_TRACT | Status: AC
  Administered 2019-11-20: 10 mg via RESPIRATORY_TRACT
  Filled 2019-11-20: qty 12

## 2019-11-20 MED ORDER — FLUTICASONE FUROATE-VILANTEROL 100-25 MCG/INH IN AEPB
1.0000 | INHALATION_SPRAY | Freq: Every day | RESPIRATORY_TRACT | Status: DC
  Administered 2019-11-20 – 2019-11-21 (×2): 1 via RESPIRATORY_TRACT
  Filled 2019-11-20: qty 28

## 2019-11-20 MED ORDER — ENOXAPARIN SODIUM 40 MG/0.4ML ~~LOC~~ SOLN
40.0000 mg | SUBCUTANEOUS | Status: DC
  Administered 2019-11-20 – 2019-11-21 (×2): 40 mg via SUBCUTANEOUS
  Filled 2019-11-20 (×2): qty 0.4

## 2019-11-20 MED ORDER — CALCIUM CARBONATE ANTACID 500 MG PO CHEW
1.0000 | CHEWABLE_TABLET | Freq: Four times a day (QID) | ORAL | Status: DC | PRN
  Administered 2019-11-20: 200 mg via ORAL
  Filled 2019-11-20: qty 1

## 2019-11-20 MED ORDER — GUAIFENESIN-DM 100-10 MG/5ML PO SYRP
5.0000 mL | ORAL_SOLUTION | ORAL | Status: DC | PRN
  Administered 2019-11-20 – 2019-11-21 (×2): 5 mL via ORAL
  Filled 2019-11-20 (×2): qty 5

## 2019-11-20 MED ORDER — PREDNISONE 20 MG PO TABS
40.0000 mg | ORAL_TABLET | Freq: Every day | ORAL | Status: DC
  Administered 2019-11-21: 40 mg via ORAL
  Filled 2019-11-20: qty 2

## 2019-11-20 NOTE — H&P (Signed)
History and Physical   Derrick Williamson:[id number] DOB: 1970/07/16 DOA: 11/19/2019  Referring MD/NP/PA: Dr Don Perking  PCP: Patient, No Pcp Per   Outpatient Specialists: None   Patient coming from: Home  Chief Complaint: Shortness of breath  HPI: Derrick Williamson is a 49 y.o. male with medical history significant of Asthma with recurrent ER visits and hospitalization who came to ER with significant SOB, cough and wheezing. Patient received 3 doses of Duoneb as well as IV steroids and continuous Albuterol treatment but not much relief so he is being admitted to the hospital for continued treatment. Denies NV. Has some cough, no fever. He has some tachycardia, has mild anxiety. .  ED Course: Temperature 98.3 blood 133/55 pulse 126 respirate of 36 oxygen sats currently 90% on room air.  Chemistry showed sodium 134 glucose is 238 but the rest of chemistry and CBC is within normal.  HIV is negative COVID-19 is negative.  EKG shows sinus tachycardia.  Patient being admitted with acute exacerbation of asthma.  Review of Systems: As per HPI otherwise 10 point review of systems negative.    Past Medical History:  Diagnosis Date   Asthma     No past surgical history on file.   reports that he has been smoking. He has never used smokeless tobacco. He reports current alcohol use of about 6.0 standard drinks of alcohol per week. He reports that he does not use drugs.  No Known Allergies  Family History  Problem Relation Age of Onset   Heart failure Neg Hx      Prior to Admission medications   Medication Sig Start Date End Date Taking? Authorizing Provider  acetaminophen (TYLENOL) 500 MG tablet Take 2 tablets (1,000 mg total) by mouth every 8 (eight) hours as needed for mild pain, moderate pain or headache. 09/04/19   Darlin Priestly, MD  albuterol (VENTOLIN HFA) 108 (90 Base) MCG/ACT inhaler Inhale 2 puffs into the lungs every 6 (six) hours as needed for wheezing or shortness of breath. Patient  not taking: Reported on 11/19/2019 08/19/19   Enid Derry, PA-C  predniSONE (DELTASONE) 10 MG tablet Take 40 mg (4 tablets) once daily from 5/15 to 5/17, then 20 mg (2 tablets) once daily from 5/18 to 5/21, then 10 mg (1 tablet) from 5/22 to 5/25, then done. Patient not taking: Reported on 11/19/2019 09/04/19   Darlin Priestly, MD    Physical Exam: Vitals:   11/19/19 2300 11/19/19 2330 11/19/19 2357 11/19/19 2359  BP: 98/74 (!) 144/73  (!) 137/69  Pulse: 78 81  78  Resp: (!) 26   (!) 24  Temp:    98.6 F (37 C)  TempSrc:    Oral  SpO2: 96% 91% 91% 94%      Constitutional: Acutely ill looking, acute distress Vitals:   11/19/19 2300 11/19/19 2330 11/19/19 2357 11/19/19 2359  BP: 98/74 (!) 144/73  (!) 137/69  Pulse: 78 81  78  Resp: (!) 26   (!) 24  Temp:    98.6 F (37 C)  TempSrc:    Oral  SpO2: 96% 91% 91% 94%   Eyes: PERRL, lids and conjunctivae normal ENMT: Mucous membranes are moist. Posterior pharynx clear of any exudate or lesions.Normal dentition.  Neck: normal, supple, no masses, no thyromegaly Respiratory: Decreased air entry bilaterally with marked expiratory wheezing, use of accessory muscle of respiration, Cardiovascular: Regular rate and rhythm, no murmurs / rubs / gallops. No extremity edema. 2+ pedal pulses. No carotid bruits.  Abdomen: no tenderness, no masses palpated. No hepatosplenomegaly. Bowel sounds positive.  Musculoskeletal: no clubbing / cyanosis. No joint deformity upper and lower extremities. Good ROM, no contractures. Normal muscle tone.  Skin: no rashes, lesions, ulcers. No induration Neurologic: CN 2-12 grossly intact. Sensation intact, DTR normal. Strength 5/5 in all 4.  Psychiatric: Normal judgment and insight. Alert and oriented x 3.  Anxious mood.     Labs on Admission: I have personally reviewed following labs and imaging studies  CBC: Recent Labs  Lab 11/19/19 2029  WBC 11.7*  NEUTROABS 7.0  HGB 16.7  HCT 47.9  MCV 88.7  PLT 190    Basic Metabolic Panel: Recent Labs  Lab 11/19/19 2029  NA 140  K 4.0  CL 103  CO2 26  GLUCOSE 96  BUN 16  CREATININE 0.75  CALCIUM 9.2   GFR: CrCl cannot be calculated (Unknown ideal weight.). Liver Function Tests: No results for input(s): AST, ALT, ALKPHOS, BILITOT, PROT, ALBUMIN in the last 168 hours. No results for input(s): LIPASE, AMYLASE in the last 168 hours. No results for input(s): AMMONIA in the last 168 hours. Coagulation Profile: No results for input(s): INR, PROTIME in the last 168 hours. Cardiac Enzymes: No results for input(s): CKTOTAL, CKMB, CKMBINDEX, TROPONINI in the last 168 hours. BNP (last 3 results) No results for input(s): PROBNP in the last 8760 hours. HbA1C: No results for input(s): HGBA1C in the last 72 hours. CBG: No results for input(s): GLUCAP in the last 168 hours. Lipid Profile: No results for input(s): CHOL, HDL, LDLCALC, TRIG, CHOLHDL, LDLDIRECT in the last 72 hours. Thyroid Function Tests: No results for input(s): TSH, T4TOTAL, FREET4, T3FREE, THYROIDAB in the last 72 hours. Anemia Panel: No results for input(s): VITAMINB12, FOLATE, FERRITIN, TIBC, IRON, RETICCTPCT in the last 72 hours. Urine analysis:    Component Value Date/Time   COLORURINE YELLOW (A) 07/13/2018 2223   APPEARANCEUR CLEAR (A) 07/13/2018 2223   LABSPEC 1.020 07/13/2018 2223   PHURINE 6.0 07/13/2018 2223   GLUCOSEU NEGATIVE 07/13/2018 2223   HGBUR SMALL (A) 07/13/2018 2223   BILIRUBINUR NEGATIVE 07/13/2018 2223   KETONESUR NEGATIVE 07/13/2018 2223   PROTEINUR NEGATIVE 07/13/2018 2223   NITRITE NEGATIVE 07/13/2018 2223   LEUKOCYTESUR NEGATIVE 07/13/2018 2223   Sepsis Labs: @LABRCNTIP (procalcitonin:4,lacticidven:4) )No results found for this or any previous visit (from the past 240 hour(s)).   Radiological Exams on Admission: DG Chest Portable 1 View  Result Date: 11/19/2019 CLINICAL DATA:  Shortness of breath EXAM: PORTABLE CHEST 1 VIEW COMPARISON:   09/02/2019 FINDINGS: Cardiac shadow is stable. The lungs are well aerated bilaterally. No focal infiltrate or sizable effusion is seen. Previously seen interstitial markings are stable. No bony abnormality is noted. IMPRESSION: No acute abnormality noted. Electronically Signed   By: 11/02/2019 M.D.   On: 11/19/2019 20:48    EKG: Independently reviewed.  Sinus tachycardia otherwise no significant  Assessment/Plan Principal Problem:   Asthma exacerbation attacks Active Problems:   Acute respiratory failure with hypoxia (HCC)    #1 acute hypoxic respiratory failure: Secondary to acute asthma exacerbation.  Will treat underlying condition with nebulizer, steroids, IV fluids as well as  #2 Acute Exacerbation of Asthma: Continue as above.  #3 Hyperglycemia: Steroid induced. Patient may need SSI   DVT prophylaxis: Lovenox  Code Status: Full   Family Communication: No Family at bedside  Disposition Plan: Home  Consults called: None  Admission status: Admisssion   Severity of Illness: The appropriate patient status for this patient  is INPATIENT. Inpatient status is judged to be reasonable and necessary in order to provide the required intensity of service to ensure the patient's safety. The patient's presenting symptoms, physical exam findings, and initial radiographic and laboratory data in the context of their chronic comorbidities is felt to place them at high risk for further clinical deterioration. Furthermore, it is not anticipated that the patient will be medically stable for discharge from the hospital within 2 midnights of admission. The following factors support the patient status of inpatient.   " The patient's presenting symptoms include shortness of breath with wheezing. " The worrisome physical exam findings include marked expiratory wheezing. " The initial radiographic and laboratory data are worrisome because of mild hyperglycemia. " The chronic co-morbidities include history  of asthma.   * I certify that at the point of admission it is my clinical judgment that the patient will require inpatient hospital care spanning beyond 2 midnights from the point of admission due to high intensity of service, high risk for further deterioration and high frequency of surveillance required.Lonia Blood MD [facility] Pager [phone]  If 7PM-7AM, please contact night-coverage [email or web address] Password [facility]  11/20/2019, 12:33 AM

## 2019-11-20 NOTE — Progress Notes (Signed)
PT Cancellation Note  Patient Details Name: Derrick Williamson MRN: [id number] DOB: [birth year]   Cancelled Treatment:    Reason Eval/Treat Not Completed: Other (comment) Chart reviewed, called nurse on way to ED and cleared to work with PT.  Approximately 5 minutes later arrived to room and nursing was getting ready to give him a breathing treatment and said that now absolutely would not work for him to get up with PT.  Pt indicated that when he is breathing okay he is able to be fully active and independent.  Suspect that he will have little in the way of actual PT needs when/if we are able to see him.  Hope to return this afternoon per availability.   Malachi Pro, DPT 11/20/2019, 11:42 AM

## 2019-11-20 NOTE — Progress Notes (Signed)
OT Cancellation Note  Patient Details Name: Derrick Williamson MRN: [id number] DOB: 08-06-1970   Cancelled Treatment:    Reason Eval/Treat Not Completed: OT screened, no needs identified, will sign off. Order received and chart reviewed. Spoke with physical therapist who saw pt just prior to attempted OT evaluation. Per physical therapist, pt performing functional mobility independently, only limited by pulmonary status however spO2 remains stable with functional mobility. Pt at baseline level for functional ADL performance. No skilled needs identified. Will sign off at this time. Please re-consult if additional OT needs arise during this admission.    Rockney Ghee, M.S., OTR/L Ascom: [phone] 11/20/19, 2:50 PM

## 2019-11-20 NOTE — Progress Notes (Signed)
PROGRESS NOTE    Derrick Williamson  JAS:[id number] DOB: 01-13-1971 DOA: 11/19/2019 PCP: Patient, No Pcp Per    Assessment & Plan:   Principal Problem:   Asthma exacerbation attacks Active Problems:   Acute respiratory failure with hypoxia (HCC)    Derrick Williamson is a 49 y.o. Hispanic male with medical history significant of Asthma with recurrent ER visits and hospitalization, current smoker who came to ER with significant SOB, cough and wheezing.    Patient received 3 doses of Duoneb as well as IV steroids and continuous Albuterol treatment in the ED but not much relief so was admitted to the hospital for continued treatment.      # acute hypoxic respiratory failure Secondary to acute asthma exacerbation.   --Will treat underlying condition with nebulizer, steroids --d/c MIVF to keep lungs dry  # Acute Exacerbation of Asthma # Frequent hospital visits for the same --Pt appears to only have albuterol inhaler PRN at home.  Pt admitted to smoking and reported a direct associated with how much he smokes to his asthma exacerbation.  Pt said he sees a nurse at work for his asthma needs. PLAN: --continue steroid and taper --continue DuoNeb q6h --continue Breo Ellipta daily --Pulm consult (Dr. Karna Christmas offered to see pt inpatient and continue to follow pt as outpatient)  # Tobacco abuse --Pt admitted to smoking and reported a direct associated with how much he smokes to his asthma exacerbation.  --Smoking cessation encouraged.  # Hyperglycemia 2/2 Steroid use --monitor for now   DVT prophylaxis: Lovenox SQ Code Status: Full code  Family Communication:  Status is: inpatient Dispo:   The patient is from: home Anticipated d/c is to: home Anticipated d/c date is: likely tomorrow after seeing pulm consult  Patient currently is not medically stable to d/c due to: being treated for asthma exacerbation.  Will have pt see pulm consult prior to discharge to optimize his asthma regimen to  avoid future hospital visits.   Subjective and Interval History:  Pt reported breathing improved today.  No fever.  No O2 requirement.  Normal PO intake.  Pt admitted to smoking and reported a direct associated with how much he smokes to his asthma exacerbation.   Objective: Vitals:   11/20/19 1648 11/20/19 1833 11/20/19 1918 11/20/19 1936  BP: (!) 143/69  (!) 153/68   Pulse: 94  98   Resp: 20  20   Temp: 98.3 F (36.8 C) 98.3 F (36.8 C) 98.6 F (37 C)   TempSrc:  Oral Oral   SpO2: 95%  96%   Weight:      Height:    5\' 8"  (1.727 m)    Intake/Output Summary (Last 24 hours) at 11/20/2019 2115 Last data filed at 11/20/2019 0600 Gross per 24 hour  Intake 50 ml  Output 250 ml  Net -200 ml   Filed Weights   11/20/19 0200  Weight: 74.5 kg    Examination:   Constitutional: NAD, AAOx3 HEENT: conjunctivae and lids normal, EOMI CV: RRR no M,R,G. Distal pulses +2.  No cyanosis.   RESP: reduced breath sounds, no obvious wheezes, on RA GI: +BS, NTND Extremities: No effusions, edema, or tenderness in BLE SKIN: warm, dry and intact Neuro: II - XII grossly intact.  Sensation intact Psych: Normal mood and affect.     Data Reviewed: I have personally reviewed following labs and imaging studies  CBC: Recent Labs  Lab 11/19/19 2029 11/20/19 0628  WBC 11.7* 6.5  NEUTROABS 7.0  --  HGB 16.7 15.1  HCT 47.9 43.2  MCV 88.7 88.0  PLT 190 169   Basic Metabolic Panel: Recent Labs  Lab 11/19/19 2029 11/20/19 0628  NA 140 134*  K 4.0 4.0  CL 103 104  CO2 26 20*  GLUCOSE 96 238*  BUN 16 18  CREATININE 0.75 0.85  0.91  CALCIUM 9.2 9.0   GFR: Estimated Creatinine Clearance: 97.1 mL/min (by C-G formula based on SCr of 0.91 mg/dL). Liver Function Tests: No results for input(s): AST, ALT, ALKPHOS, BILITOT, PROT, ALBUMIN in the last 168 hours. No results for input(s): LIPASE, AMYLASE in the last 168 hours. No results for input(s): AMMONIA in the last 168  hours. Coagulation Profile: No results for input(s): INR, PROTIME in the last 168 hours. Cardiac Enzymes: No results for input(s): CKTOTAL, CKMB, CKMBINDEX, TROPONINI in the last 168 hours. BNP (last 3 results) No results for input(s): PROBNP in the last 8760 hours. HbA1C: No results for input(s): HGBA1C in the last 72 hours. CBG: No results for input(s): GLUCAP in the last 168 hours. Lipid Profile: No results for input(s): CHOL, HDL, LDLCALC, TRIG, CHOLHDL, LDLDIRECT in the last 72 hours. Thyroid Function Tests: No results for input(s): TSH, T4TOTAL, FREET4, T3FREE, THYROIDAB in the last 72 hours. Anemia Panel: No results for input(s): VITAMINB12, FOLATE, FERRITIN, TIBC, IRON, RETICCTPCT in the last 72 hours. Sepsis Labs: No results for input(s): PROCALCITON, LATICACIDVEN in the last 168 hours.  Recent Results (from the past 240 hour(s))  SARS Coronavirus 2 by RT PCR (hospital order, performed in [facility] hospital lab) Nasopharyngeal Nasopharyngeal Swab     Status: None   Collection Time: 11/20/19 12:04 AM   Specimen: Nasopharyngeal Swab  Result Value Ref Range Status   SARS Coronavirus 2 NEGATIVE NEGATIVE Final    Comment: (NOTE) SARS-CoV-2 target nucleic acids are NOT DETECTED.  The SARS-CoV-2 RNA is generally detectable in upper and lower respiratory specimens during the acute phase of infection. The lowest concentration of SARS-CoV-2 viral copies this assay can detect is 250 copies / mL. A negative result does not preclude SARS-CoV-2 infection and should not be used as the sole basis for treatment or other patient management decisions.  A negative result may occur with improper specimen collection / handling, submission of specimen other than nasopharyngeal swab, presence of viral mutation(s) within the areas targeted by this assay, and inadequate number of viral copies (<250 copies / mL). A negative result must be combined with clinical observations, patient history,  and epidemiological information.  Fact Sheet for Patients:   BoilerBrush.com.cy  Fact Sheet for Healthcare Providers: https://pope.com/  This test is not yet approved or  cleared by the [place] FDA and has been authorized for detection and/or diagnosis of SARS-CoV-2 by FDA under an Emergency Use Authorization (EUA).  This EUA will remain in effect (meaning this test can be used) for the duration of the COVID-19 declaration under Section 564(b)(1) of the Act, 21 U.S.C. section 360bbb-3(b)(1), unless the authorization is terminated or revoked sooner.  Performed at [facility], [address], [place], [place] [address]       Radiology Studies: DG Chest Portable 1 View  Result Date: 11/19/2019 CLINICAL DATA:  Shortness of breath EXAM: PORTABLE CHEST 1 VIEW COMPARISON:  09/02/2019 FINDINGS: Cardiac shadow is stable. The lungs are well aerated bilaterally. No focal infiltrate or sizable effusion is seen. Previously seen interstitial markings are stable. No bony abnormality is noted. IMPRESSION: No acute abnormality noted. Electronically Signed  By: Alcide Clever M.D.   On: 11/19/2019 20:48     Scheduled Meds: . enoxaparin (LOVENOX) injection  40 mg Subcutaneous Q24H  . fluticasone furoate-vilanterol  1 puff Inhalation Daily  . ipratropium-albuterol  3 mL Nebulization Q6H  . [START ON 11/21/2019] predniSONE  40 mg Oral Q breakfast   Continuous Infusions:   LOS: 0 days     Darlin Priestly, MD [facility] If 7PM-7AM, please contact night-coverage 11/20/2019, 9:15 PM

## 2019-11-20 NOTE — ED Provider Notes (Signed)
Patient received 3 DuoNeb's and was placed on 15 mg of albuterol continuous for an hour.  Reassessed an hour after and is still having some wheezing.  Will desat to 90 to 91% with ambulation and respiratory rate goes up to the 30s.  Therefore will admit to the hospitalist service.  We will put patient in another hour of continuous albuterol.   Don Perking, [place], MD 11/20/19 0005

## 2019-11-20 NOTE — Progress Notes (Signed)
PT Screen Note  Patient Details Name: Derrick Williamson MRN: [id number] DOB: Jun 13, 1970   Cancelled Treatment:    Reason Eval/Treat Not Completed: PT screened, no needs identified, will sign off  No PT issues noted during PT screen. We walked ~400 ft at self selected speed >1.3 m/sec, no balance, or safety issues, mild subjective fatigue. O2 stayed in the 90s on room air t/o the effort, HR however did stay >100 the whole time and was in the 120s most of the time. But again, no PT issues, signing off.    Malachi Pro, DPT 11/20/2019, 3:21 PM

## 2019-11-21 LAB — BASIC METABOLIC PANEL
Anion gap: 10 (ref 5–15)
BUN: 19 mg/dL (ref 6–20)
CO2: 21 mmol/L — ABNORMAL LOW (ref 22–32)
Calcium: 8.9 mg/dL (ref 8.9–10.3)
Chloride: 102 mmol/L (ref 98–111)
Creatinine, Ser: 0.76 mg/dL (ref 0.61–1.24)
GFR calc Af Amer: >60 mL/min (ref >60)
GFR calc non Af Amer: >60 mL/min (ref >60)
Glucose, Bld: 167 mg/dL — ABNORMAL HIGH (ref 70–99)
Potassium: 4.1 mmol/L (ref 3.5–5.1)
Sodium: 133 mmol/L — ABNORMAL LOW (ref 135–145)

## 2019-11-21 LAB — CBC
HCT: 42.9 % (ref 39.0–52.0)
Hemoglobin: 15.5 g/dL (ref 13.0–17.0)
MCH: 31.3 pg (ref 26.0–34.0)
MCHC: 36.1 g/dL — ABNORMAL HIGH (ref 30.0–36.0)
MCV: 86.7 fL (ref 80.0–100.0)
Platelets: 177 10*3/uL (ref 150–400)
RBC: 4.95 MIL/uL (ref 4.22–5.81)
RDW: 12.6 % (ref 11.5–15.5)
WBC: 12.8 10*3/uL — ABNORMAL HIGH (ref 4.0–10.5)
nRBC: 0 % (ref 0.0–0.2)

## 2019-11-21 LAB — MAGNESIUM: Magnesium: 1.8 mg/dL (ref 1.7–2.4)

## 2019-11-21 MED ORDER — FLUTICASONE FUROATE-VILANTEROL 100-25 MCG/INH IN AEPB: 1.0000 | INHALATION_SPRAY | Freq: Every day | RESPIRATORY_TRACT | 0 refills | Status: DC

## 2019-11-21 MED ORDER — PREDNISONE 20 MG PO TABS: 40.0000 mg | ORAL_TABLET | Freq: Every day | ORAL | 0 refills | Status: AC

## 2019-11-21 MED ORDER — NICOTINE 21 MG/24HR TD PT24
21.0000 mg | MEDICATED_PATCH | TRANSDERMAL | 1 refills | Status: DC
Start: 2019-11-21 — End: 2020-10-08

## 2019-11-21 NOTE — Discharge Summary (Signed)
Physician Discharge Summary  Derrick Williamson POE:[id number] DOB: 08-11-1970 DOA: 11/19/2019  PCP: Patient, No Pcp Per  Admit date: 11/19/2019 Discharge date: 11/21/2019  Admitted From: Home Disposition:  Home  Recommendations for Outpatient Follow-up:  1. Follow up with PCP in 1-2 weeks 2. Please obtain BMP/CBC in one week Please follow up on the following pending results:None  Home Health:No Equipment/Devices:None Discharge Condition: Stable CODE STATUS: Full Diet recommendation: Heart Healthy / Carb Modified   Brief/Interim Summary: Derrick Williamson a 49 y.o.Hispanic malewith medical history significant ofAsthma with recurrent ER visits and hospitalization, current smoker who came to ER with significant SOB, cough and wheezing.    Patient received 3 doses of Duoneb as well as IV steroids and continuous Albuterol treatment in the ED but not much relief so was admitted to the hospital for continued treatment.  Patient was initially treated with IV steroid and later transitioned to prednisone.  He was given a 5-day course of prednisone for home.  Patient was just on albuterol inhaler as needed, we started him on Breo Ellipta.  Discussed about smoking cessation and a close follow-up with a pulmonologist for further management.  He was provided with contact information for Dr. Karna Christmas instructed to call him on Monday for appointment.  Discharge Diagnoses:  Principal Problem:   Asthma exacerbation attacks Active Problems:   Acute respiratory failure with hypoxia [facility])   Discharge Instructions  Discharge Instructions    Diet - low sodium heart healthy   Complete by: As directed    Discharge instructions   Complete by: As directed    It was pleasure taking care of you. You are being given a new inhaler, use it daily as directed. You are also being given prednisone for 4 more days. Please make an appointment with pulmonologist. As we discussed it is very important that you quit  smoking to prevent future exacerbations of your asthma, I gave you a prescription of nicotine patches to help with that, please contact your primary care provider for refills if needed.   Increase activity slowly   Complete by: As directed      Allergies as of 11/21/2019   No Known Allergies     Medication List    TAKE these medications   acetaminophen 500 MG tablet Commonly known as: TYLENOL Take 2 tablets (1,000 mg total) by mouth every 8 (eight) hours as needed for mild pain, moderate pain or headache.   albuterol 108 (90 Base) MCG/ACT inhaler Commonly known as: VENTOLIN HFA Inhale 2 puffs into the lungs every 6 (six) hours as needed for wheezing or shortness of breath.   fluticasone furoate-vilanterol 100-25 MCG/INH Aepb Commonly known as: BREO ELLIPTA Inhale 1 puff into the lungs daily. Start taking on: November 22, 2019   nicotine 21 mg/24hr patch Commonly known as: NICODERM CQ - dosed in mg/24 hours Place 1 patch (21 mg total) onto the skin daily.   predniSONE 20 MG tablet Commonly known as: DELTASONE Take 2 tablets (40 mg total) by mouth daily with breakfast for 4 days. Start taking on: November 22, 2019 What changed:   medication strength  how much to take  how to take this  when to take this  additional instructions       Follow-up Information    Vida Rigger, MD. Schedule an appointment as soon as possible for a visit.   Specialty: Pulmonary Disease Why: Please call Monday to make an appointment Contact information: [address] [place] [address] [phone]  No Known Allergies  Consultations:  None  Procedures/Studies: DG Chest Portable 1 View  Result Date: 11/19/2019 CLINICAL DATA:  Shortness of breath EXAM: PORTABLE CHEST 1 VIEW COMPARISON:  09/02/2019 FINDINGS: Cardiac shadow is stable. The lungs are well aerated bilaterally. No focal infiltrate or sizable effusion is seen. Previously seen interstitial  markings are stable. No bony abnormality is noted. IMPRESSION: No acute abnormality noted. Electronically Signed   By: Alcide Clever M.D.   On: 11/19/2019 20:48     Subjective: Patient was feeling better when seen today.  Has an extensive counseling regarding smoking cessation.  Girlfriend was in the room who also smokes.  They both keep blaming each other for continuation of smoking.  Discharge Exam: Vitals:   11/21/19 0535 11/21/19 1217  BP: (!) 126/52 113/73  Pulse: 89 84  Resp: 20 (!) 24  Temp: (!) 96.8 F (36 C) 97.6 F (36.4 C)  SpO2: 100% 96%   Vitals:   11/20/19 1918 11/20/19 1936 11/21/19 0535 11/21/19 1217  BP: (!) 153/68  (!) 126/52 113/73  Pulse: 98  89 84  Resp: 20  20 (!) 24  Temp: 98.6 F (37 C)  (!) 96.8 F (36 C) 97.6 F (36.4 C)  TempSrc: Oral   Oral  SpO2: 96%  100% 96%  Weight:      Height:  5\' 8"  (1.727 m)      General: Pt is alert, awake, not in acute distress Cardiovascular: RRR, S1/S2 +, no rubs, no gallops Respiratory: CTA bilaterally, no wheezing, no rhonchi Abdominal: Soft, NT, ND, bowel sounds + Extremities: no edema, no cyanosis   The results of significant diagnostics from this hospitalization (including imaging, microbiology, ancillary and laboratory) are listed below for reference.    Microbiology: Recent Results (from the past 240 hour(s))  SARS Coronavirus 2 by RT PCR (hospital order, performed in [facility] hospital lab) Nasopharyngeal Nasopharyngeal Swab     Status: None   Collection Time: 11/20/19 12:04 AM   Specimen: Nasopharyngeal Swab  Result Value Ref Range Status   SARS Coronavirus 2 NEGATIVE NEGATIVE Final    Comment: (NOTE) SARS-CoV-2 target nucleic acids are NOT DETECTED.  The SARS-CoV-2 RNA is generally detectable in upper and lower respiratory specimens during the acute phase of infection. The lowest concentration of SARS-CoV-2 viral copies this assay can detect is 250 copies / mL. A negative result does not  preclude SARS-CoV-2 infection and should not be used as the sole basis for treatment or other patient management decisions.  A negative result may occur with improper specimen collection / handling, submission of specimen other than nasopharyngeal swab, presence of viral mutation(s) within the areas targeted by this assay, and inadequate number of viral copies (<250 copies / mL). A negative result must be combined with clinical observations, patient history, and epidemiological information.  Fact Sheet for Patients:   11/22/19  Fact Sheet for Healthcare Providers: BoilerBrush.com.cy  This test is not yet approved or  cleared by the https://pope.com/ FDA and has been authorized for detection and/or diagnosis of SARS-CoV-2 by FDA under an Emergency Use Authorization (EUA).  This EUA will remain in effect (meaning this test can be used) for the duration of the COVID-19 declaration under Section 564(b)(1) of the Act, 21 U.S.C. section 360bbb-3(b)(1), unless the authorization is terminated or revoked sooner.  Performed at [facility], [address] [place], [place]      Labs: BNP (last 3 results) No results for input(s): BNP in  the last 8760 hours. Basic Metabolic Panel: Recent Labs  Lab 11/19/19 2029 11/20/19 0628 11/21/19 0502  NA 140 134* 133*  K 4.0 4.0 4.1  CL 103 104 102  CO2 26 20* 21*  GLUCOSE 96 238* 167*  BUN 16 18 19   CREATININE 0.75 0.85  0.91 0.76  CALCIUM 9.2 9.0 8.9  MG  --   --  1.8   Liver Function Tests: No results for input(s): AST, ALT, ALKPHOS, BILITOT, PROT, ALBUMIN in the last 168 hours. No results for input(s): LIPASE, AMYLASE in the last 168 hours. No results for input(s): AMMONIA in the last 168 hours. CBC: Recent Labs  Lab 11/19/19 2029 11/20/19 0628 11/21/19 0502  WBC 11.7* 6.5 12.8*  NEUTROABS 7.0  --   --   HGB 16.7 15.1 15.5  HCT 47.9 43.2 42.9  MCV 88.7  88.0 86.7  PLT 190 169 177   Cardiac Enzymes: No results for input(s): CKTOTAL, CKMB, CKMBINDEX, TROPONINI in the last 168 hours. BNP: Invalid input(s): POCBNP CBG: No results for input(s): GLUCAP in the last 168 hours. D-Dimer No results for input(s): DDIMER in the last 72 hours. Hgb A1c No results for input(s): HGBA1C in the last 72 hours. Lipid Profile No results for input(s): CHOL, HDL, LDLCALC, TRIG, CHOLHDL, LDLDIRECT in the last 72 hours. Thyroid function studies No results for input(s): TSH, T4TOTAL, T3FREE, THYROIDAB in the last 72 hours.  Invalid input(s): FREET3 Anemia work up No results for input(s): VITAMINB12, FOLATE, FERRITIN, TIBC, IRON, RETICCTPCT in the last 72 hours. Urinalysis    Component Value Date/Time   COLORURINE YELLOW (A) 07/13/2018 2223   APPEARANCEUR CLEAR (A) 07/13/2018 2223   LABSPEC 1.020 07/13/2018 2223   PHURINE 6.0 07/13/2018 2223   GLUCOSEU NEGATIVE 07/13/2018 2223   HGBUR SMALL (A) 07/13/2018 2223   BILIRUBINUR NEGATIVE 07/13/2018 2223   KETONESUR NEGATIVE 07/13/2018 2223   PROTEINUR NEGATIVE 07/13/2018 2223   NITRITE NEGATIVE 07/13/2018 2223   LEUKOCYTESUR NEGATIVE 07/13/2018 2223   Sepsis Labs Invalid input(s): PROCALCITONIN,  WBC,  LACTICIDVEN Microbiology Recent Results (from the past 240 hour(s))  SARS Coronavirus 2 by RT PCR (hospital order, performed in [facility] hospital lab) Nasopharyngeal Nasopharyngeal Swab     Status: None   Collection Time: 11/20/19 12:04 AM   Specimen: Nasopharyngeal Swab  Result Value Ref Range Status   SARS Coronavirus 2 NEGATIVE NEGATIVE Final    Comment: (NOTE) SARS-CoV-2 target nucleic acids are NOT DETECTED.  The SARS-CoV-2 RNA is generally detectable in upper and lower respiratory specimens during the acute phase of infection. The lowest concentration of SARS-CoV-2 viral copies this assay can detect is 250 copies / mL. A negative result does not preclude SARS-CoV-2 infection and should not  be used as the sole basis for treatment or other patient management decisions.  A negative result may occur with improper specimen collection / handling, submission of specimen other than nasopharyngeal swab, presence of viral mutation(s) within the areas targeted by this assay, and inadequate number of viral copies (<250 copies / mL). A negative result must be combined with clinical observations, patient history, and epidemiological information.  Fact Sheet for Patients:   11/22/19  Fact Sheet for Healthcare Providers: BoilerBrush.com.cy  This test is not yet approved or  cleared by the https://pope.com/ FDA and has been authorized for detection and/or diagnosis of SARS-CoV-2 by FDA under an Emergency Use Authorization (EUA).  This EUA will remain in effect (meaning this test can be used) for the duration  of the COVID-19 declaration under Section 564(b)(1) of the Act, 21 U.S.C. section 360bbb-3(b)(1), unless the authorization is terminated or revoked sooner.  Performed at [facility], [address] [place], [place] [address]     Time coordinating discharge: Over 30 minutes  SIGNED:  Arnetha CourserSumayya Macarena Langseth, MD  [facility] 11/21/2019, 12:20 PM  If 7PM-7AM, please contact night-coverage [email or web address]  This record has been created using Conservation officer, historic buildingsDragon voice recognition software. Errors have been sought and corrected,but may not always be located. Such creation errors do not reflect on the standard of care.

## 2019-11-21 NOTE — Progress Notes (Signed)
Dc home viz WC.

## 2019-12-29 ENCOUNTER — Emergency Department
Admission: EM | Admit: 2019-12-29 | Discharge: 2019-12-29 | Discharge status: Against Medical Advice | Payer: BC Managed Care – PPO

## 2020-06-21 ENCOUNTER — Other Ambulatory Visit: Payer: Self-pay

## 2020-06-21 ENCOUNTER — Emergency Department: Payer: BC Managed Care – PPO

## 2020-06-21 ENCOUNTER — Inpatient Hospital Stay
Admission: EM | Admit: 2020-06-21 | Discharge: 2020-06-24 | DRG: 202 | Disposition: A | Discharge status: Home / Self Care | Payer: BC Managed Care – PPO | Attending: Internal Medicine | Admitting: Internal Medicine

## 2020-06-21 ENCOUNTER — Encounter: Payer: Self-pay | Admitting: Emergency Medicine

## 2020-06-21 DIAGNOSIS — J9601 Acute respiratory failure with hypoxia: Secondary | ICD-10-CM | POA: Diagnosis present

## 2020-06-21 DIAGNOSIS — J45901 Unspecified asthma with (acute) exacerbation: Secondary | ICD-10-CM | POA: Diagnosis present

## 2020-06-21 DIAGNOSIS — E0590 Thyrotoxicosis, unspecified without thyrotoxic crisis or storm: Secondary | ICD-10-CM | POA: Diagnosis present

## 2020-06-21 DIAGNOSIS — F17210 Nicotine dependence, cigarettes, uncomplicated: Secondary | ICD-10-CM | POA: Diagnosis present

## 2020-06-21 DIAGNOSIS — J4551 Severe persistent asthma with (acute) exacerbation: Secondary | ICD-10-CM | POA: Diagnosis not present

## 2020-06-21 DIAGNOSIS — Z720 Tobacco use: Secondary | ICD-10-CM | POA: Diagnosis present

## 2020-06-21 DIAGNOSIS — Z79899 Other long term (current) drug therapy: Secondary | ICD-10-CM

## 2020-06-21 DIAGNOSIS — Z23 Encounter for immunization: Secondary | ICD-10-CM

## 2020-06-21 DIAGNOSIS — Z20822 Contact with and (suspected) exposure to covid-19: Secondary | ICD-10-CM | POA: Diagnosis present

## 2020-06-21 LAB — BASIC METABOLIC PANEL
Anion gap: 10 (ref 5–15)
BUN: 14 mg/dL (ref 6–20)
CO2: 24 mmol/L (ref 22–32)
Calcium: 9 mg/dL (ref 8.9–10.3)
Chloride: 101 mmol/L (ref 98–111)
Creatinine, Ser: 0.78 mg/dL (ref 0.61–1.24)
GFR, Estimated: >60 mL/min (ref >60)
Glucose, Bld: 110 mg/dL — ABNORMAL HIGH (ref 70–99)
Potassium: 3.7 mmol/L (ref 3.5–5.1)
Sodium: 135 mmol/L (ref 135–145)

## 2020-06-21 LAB — RESP PANEL BY RT-PCR (FLU A&B, COVID) ARPGX2
Influenza A by PCR: NEGATIVE
Influenza B by PCR: NEGATIVE
SARS Coronavirus 2 by RT PCR: NEGATIVE

## 2020-06-21 LAB — CBC
HCT: 47 % (ref 39.0–52.0)
Hemoglobin: 16.5 g/dL (ref 13.0–17.0)
MCH: 30.3 pg (ref 26.0–34.0)
MCHC: 35.1 g/dL (ref 30.0–36.0)
MCV: 86.4 fL (ref 80.0–100.0)
Platelets: 172 10*3/uL (ref 150–400)
RBC: 5.44 MIL/uL (ref 4.22–5.81)
RDW: 12.4 % (ref 11.5–15.5)
WBC: 9.2 10*3/uL (ref 4.0–10.5)
nRBC: 0 % (ref 0.0–0.2)

## 2020-06-21 LAB — TSH: TSH: <0.01 u[IU]/mL — ABNORMAL LOW (ref 0.350–4.500)

## 2020-06-21 LAB — D-DIMER, QUANTITATIVE: D-Dimer, Quant: 0.29 ug/mL-FEU (ref 0.00–0.50)

## 2020-06-21 MED ORDER — ALBUTEROL (5 MG/ML) CONTINUOUS INHALATION SOLN
10.0000 mg/h | INHALATION_SOLUTION | Freq: Once | RESPIRATORY_TRACT | Status: AC
  Administered 2020-06-21: 10 mg/h via RESPIRATORY_TRACT
  Filled 2020-06-21: qty 20

## 2020-06-21 MED ORDER — ACETAMINOPHEN 325 MG PO TABS
325.0000 mg | ORAL_TABLET | Freq: Four times a day (QID) | ORAL | Status: DC | PRN
  Administered 2020-06-23 (×2): 325 mg via ORAL
  Filled 2020-06-21 (×2): qty 1

## 2020-06-21 MED ORDER — NICOTINE 21 MG/24HR TD PT24
21.0000 mg | MEDICATED_PATCH | TRANSDERMAL | Status: DC
  Administered 2020-06-23 – 2020-06-24 (×2): 21 mg via TRANSDERMAL
  Filled 2020-06-21 (×3): qty 1

## 2020-06-21 MED ORDER — IPRATROPIUM-ALBUTEROL 0.5-2.5 (3) MG/3ML IN SOLN
3.0000 mL | Freq: Once | RESPIRATORY_TRACT | Status: AC
  Administered 2020-06-21: 3 mL via RESPIRATORY_TRACT
  Filled 2020-06-21: qty 3

## 2020-06-21 MED ORDER — ONDANSETRON HCL 4 MG PO TABS
4.0000 mg | ORAL_TABLET | Freq: Four times a day (QID) | ORAL | Status: DC | PRN
Start: 2020-06-21 — End: 2020-06-24

## 2020-06-21 MED ORDER — FLUTICASONE FUROATE-VILANTEROL 100-25 MCG/INH IN AEPB
1.0000 | INHALATION_SPRAY | Freq: Every day | RESPIRATORY_TRACT | Status: DC
  Administered 2020-06-22 – 2020-06-24 (×3): 1 via RESPIRATORY_TRACT
  Filled 2020-06-21 (×3): qty 28

## 2020-06-21 MED ORDER — LEVALBUTEROL HCL 1.25 MG/0.5ML IN NEBU: 1.2500 mg | INHALATION_SOLUTION | Freq: Three times a day (TID) | RESPIRATORY_TRACT | Status: DC

## 2020-06-21 MED ORDER — METHYLPREDNISOLONE SODIUM SUCC 125 MG IJ SOLR
80.0000 mg | Freq: Every day | INTRAMUSCULAR | Status: AC
  Administered 2020-06-22 – 2020-06-23 (×2): 80 mg via INTRAVENOUS
  Filled 2020-06-21 (×2): qty 2

## 2020-06-21 MED ORDER — IPRATROPIUM-ALBUTEROL 0.5-2.5 (3) MG/3ML IN SOLN
3.0000 mL | Freq: Four times a day (QID) | RESPIRATORY_TRACT | Status: AC
  Administered 2020-06-21 – 2020-06-23 (×8): 3 mL via RESPIRATORY_TRACT
  Filled 2020-06-21 (×8): qty 3

## 2020-06-21 MED ORDER — MAGNESIUM SULFATE 2 GM/50ML IV SOLN
2.0000 g | Freq: Once | INTRAVENOUS | Status: AC
  Administered 2020-06-21: 2 g via INTRAVENOUS
  Filled 2020-06-21: qty 50

## 2020-06-21 MED ORDER — ENOXAPARIN SODIUM 40 MG/0.4ML ~~LOC~~ SOLN
40.0000 mg | SUBCUTANEOUS | Status: DC
  Administered 2020-06-22 – 2020-06-23 (×2): 40 mg via SUBCUTANEOUS
  Filled 2020-06-21 (×2): qty 0.4

## 2020-06-21 MED ORDER — LEVALBUTEROL HCL 1.25 MG/0.5ML IN NEBU
1.2500 mg | INHALATION_SOLUTION | Freq: Three times a day (TID) | RESPIRATORY_TRACT | Status: AC
  Administered 2020-06-22: 1.25 mg via RESPIRATORY_TRACT
  Filled 2020-06-21 (×2): qty 0.5

## 2020-06-21 MED ORDER — ALBUTEROL SULFATE HFA 108 (90 BASE) MCG/ACT IN AERS
2.0000 | INHALATION_SPRAY | Freq: Four times a day (QID) | RESPIRATORY_TRACT | Status: DC | PRN
  Administered 2020-06-21 – 2020-06-22 (×3): 2 via RESPIRATORY_TRACT
  Filled 2020-06-21: qty 6.7

## 2020-06-21 MED ORDER — METHYLPREDNISOLONE SODIUM SUCC 125 MG IJ SOLR
125.0000 mg | INTRAMUSCULAR | Status: AC
  Administered 2020-06-21: 125 mg via INTRAVENOUS
  Filled 2020-06-21: qty 2

## 2020-06-21 MED ORDER — ACETAMINOPHEN 650 MG RE SUPP: 325.0000 mg | Freq: Four times a day (QID) | RECTAL | Status: DC | PRN

## 2020-06-21 MED ORDER — ONDANSETRON HCL 4 MG/2ML IJ SOLN: 4.0000 mg | Freq: Four times a day (QID) | INTRAMUSCULAR | Status: DC | PRN

## 2020-06-21 MED ORDER — LEVALBUTEROL HCL 1.25 MG/0.5ML IN NEBU
1.2500 mg | INHALATION_SOLUTION | Freq: Once | RESPIRATORY_TRACT | Status: AC
  Administered 2020-06-21: 1.25 mg via RESPIRATORY_TRACT
  Filled 2020-06-21: qty 0.5

## 2020-06-21 NOTE — Progress Notes (Signed)
Late entry: approx 2013 assessed patient's bipap, found patient off bipap, no distress noted. 2lnc, will continue to monitor

## 2020-06-21 NOTE — ED Notes (Signed)
Dr. Cox at bedside.  

## 2020-06-21 NOTE — ED Provider Notes (Signed)
[facility] Emergency Department Provider Note   ____________________________________________   Event Date/Time   First MD Initiated Contact with Patient 06/21/20 1525     (approximate)  I have reviewed the triage vital signs and the nursing notes.   HISTORY  Chief Complaint Asthma  EM caveat: Severe dyspnea respiratory distress obvious  HPI Derrick Williamson is a 50 y.o. male   reports he cannot breathe "asthma".  He reports he is short of breath and has been coughing for several days worsening over the last day  Denies pain.  Denies fever.  Reports shortness of breath and "asthma"  Past Medical History:  Diagnosis Date  . Asthma     Patient Active Problem List   Diagnosis Date Noted  . Asthma exacerbation attacks 11/20/2019  . Asthma exacerbation 09/03/2019  . Acute respiratory failure with hypoxia (HCC) 09/02/2019  . Acute asthma exacerbation 09/02/2019  . Left sided chest pain 12/01/2014  . Community acquired pneumonia 12/01/2014    History reviewed. No pertinent surgical history.  Prior to Admission medications   Medication Sig Start Date End Date Taking? Authorizing Provider  acetaminophen (TYLENOL) 500 MG tablet Take 2 tablets (1,000 mg total) by mouth every 8 (eight) hours as needed for mild pain, moderate pain or headache. 09/04/19   Darlin Priestly, MD  albuterol (VENTOLIN HFA) 108 (90 Base) MCG/ACT inhaler Inhale 2 puffs into the lungs every 6 (six) hours as needed for wheezing or shortness of breath. Patient not taking: Reported on 11/19/2019 08/19/19   Enid Derry, PA-C  fluticasone furoate-vilanterol (BREO ELLIPTA) 100-25 MCG/INH AEPB Inhale 1 puff into the lungs daily. 11/22/19   Arnetha Courser, MD  nicotine (NICODERM CQ - DOSED IN MG/24 HOURS) 21 mg/24hr patch Place 1 patch (21 mg total) onto the skin daily. 11/21/19 11/20/20  Arnetha Courser, MD    Allergies Patient has no known allergies.  Family History  Problem Relation Age of Onset   . Heart failure Neg Hx     Social History Social History   Tobacco Use  . Smoking status: Current Every Day Smoker  . Smokeless tobacco: Never Used  . Tobacco comment: pt states only smoke little   Vaping Use  . Vaping Use: Never used  Substance Use Topics  . Alcohol use: Yes    Alcohol/week: 6.0 standard drinks    Types: 6 Cans of beer per week  . Drug use: No    Review of Systems  EM caveat  ____________________________________________   PHYSICAL EXAM:  VITAL SIGNS: ED Triage Vitals [06/21/20 1525]  Enc Vitals Group     BP (!) 171/99     Pulse Rate (!) 108     Resp      Temp 98.5 F (36.9 C)     Temp Source Oral     SpO2 92 %     Weight      Height      Head Circumference      Peak Flow      Pain Score      Pain Loc      Pain Edu?      Excl. in GC?     Constitutional: Alert and oriented.  Acutely hypoxic and obvious increased work of breathing. Eyes: Conjunctivae are normal. Head: Atraumatic. Nose: No congestion/rhinnorhea. Mouth/Throat: Mucous membranes are moist. Neck: No stridor.  Cardiovascular: Slightly tachycardic rate, regular rhythm. Grossly normal heart sounds.  Good peripheral circulation. Respiratory: Diminished lung sounds bilateral, accessory muscle use, somewhat tripoding,  appears acutely dyspneic with obvious respiratory distress.  Mild end expiratory wheezing, overall though suspect very poor lung volume Gastrointestinal: Soft and nontender. No distention. Musculoskeletal: No lower extremity tenderness nor edema. Neurologic:  Normal speech and language. No gross focal neurologic deficits are appreciated.  Skin:  Skin is warm, dry and intact. No rash noted. Psychiatric: Mood and affect are normal. Speech and behavior are normal.  ____________________________________________   LABS (all labs ordered are listed, but only abnormal results are displayed)  Labs Reviewed  BASIC METABOLIC PANEL - Abnormal; Notable for the following  components:      Result Value   Glucose, Bld 110 (*)    All other components within normal limits  RESP PANEL BY RT-PCR (FLU A&B, COVID) [id number]  CBC   ____________________________________________  EKG  Reviewed inter by me at 1520 Heart rate 110 QRS 80 QTc 440 Sinus tachycardia, no evidence of acute ischemia. ____________________________________________  RADIOLOGY  DG Chest Portable 1 View  Result Date: 06/21/2020 CLINICAL DATA:  Dyspnea EXAM: PORTABLE CHEST 1 VIEW COMPARISON:  11/19/2019 FINDINGS: The heart size and mediastinal contours are within normal limits. Both lungs are clear. The visualized skeletal structures are unremarkable. IMPRESSION: No active disease. Electronically Signed   By: Alcide Clever M.D.   On: 06/21/2020 16:04    Imaging reviewed, chest x-ray negative for acute ____________________________________________   PROCEDURES  Procedure(s) performed: None  Procedures  Critical Care performed: Yes, see critical care note(s)  CRITICAL CARE Performed by: Sharyn Creamer   Total critical care time: 35 minutes  Critical care time was exclusive of separately billable procedures and treating other patients.  Critical care was necessary to treat or prevent imminent or life-threatening deterioration.  Critical care was time spent personally by me on the following activities: development of treatment plan with patient and/or surrogate as well as nursing, discussions with consultants, evaluation of patient's response to treatment, examination of patient, obtaining history from patient or surrogate, ordering and performing treatments and interventions, ordering and review of laboratory studies, ordering and review of radiographic studies, pulse oximetry and re-evaluation of patient's condition.  ____________________________________________   INITIAL IMPRESSION / ASSESSMENT AND PLAN / ED COURSE  Pertinent labs & imaging results that were available during my care of  the patient were reviewed by me and considered in my medical decision making (see chart for details).   Patient with known history of asthma presenting with severe dyspnea.  Poor lung movement and wheezing and he feels that he is having asthma exacerbation along with coughing the last few days.  He is in obvious distress.  Patient immediately ordered for BiPAP as well as aggressive treatment for asthma.  Further work-up including a reassuring EKG.  At this point labs and chest x-ray are pending.  We will continue to follow the patient very closely clinically with careful attention to respiratory distress and work of breathing.  Clinical Course as of 06/21/20 1656  Tue Jun 21, 2020  1636 Patient appears to be improving, he is resting tolerating BiPAP well at this point.  He reports feeling improved and appears much improved with regard to work of breathing. [MQ]    Clinical Course User Index [MQ] Sharyn Creamer, MD   ----------------------------------------- 4:55 PM on 06/21/2020 -----------------------------------------  Patient's work of breathing improving.  He does still have end expiratory wheezing, but he is resting comfortably and he voices that he would like to try taking BiPAP off.  He appears appropriate for this.  Will discontinue BiPAP  continue to monitor closely, and will switch to Xopenex as he remains somewhat tachycardic likely secondary to his multiple albuterol treatments.  Overall the patient condition is improving but remains with some status asthmaticus.  Discussed with patient and will admit for further care and management to the hospitalist service.  Patient clearly improving  Admission discussed with hospitalist Dr. Sedalia Muta  ____________________________________________   FINAL CLINICAL IMPRESSION(S) / ED DIAGNOSES  Final diagnoses:  Severe persistent asthma with exacerbation        Note:  This document was prepared using Dragon voice recognition software and may  include unintentional dictation errors       Sharyn Creamer, MD 06/21/20 1733

## 2020-06-21 NOTE — ED Triage Notes (Signed)
Pt arrived via POV pt struggling to stand and states SOB. Pt has hx of asthma. Pt has retractions present and labored breathing. Pt states he has inhaler but it doesn't work.

## 2020-06-21 NOTE — ED Notes (Signed)
bipap on standby

## 2020-06-21 NOTE — ED Notes (Signed)
Informed RN bed assigned 1729 

## 2020-06-21 NOTE — H&P (Signed)
History and Physical   Derrick Williamson [id number] DOB: May 14, 1970 DOA: 06/21/2020  PCP: Patient, No Pcp Per  Patient coming from: home   I have personally briefly reviewed patient's old medical records in [facility] EMR.  Chief Concern: shortness of breath  HPI: Derrick Williamson is a 50 y.o. male with medical history significant for asthma, tobacco abuse, presents to the emergency department for chief concerns of shortness of breath.  He reports that shortness of breath started about 2 days ago when he was sitting down. He denies fever, chills, nausea, vomiting, abdominal pain, dysuria, hematuria, diarrhea.  He endorses cough that is nonproductive. He endorses epigastric pain with coughing. He reports this feels similar to his previous episode of asthma exacerbation. He reports that he had bought an inhaler a few months ago however states that it stopped working and so he did not have any inhalers.  Social history: Lives with his girlfriend. He does not have children. He reports tobacco use, daily. He started smoking when he was 50 years old and smokes half a pack per day. He drinks alcohol every other day, consumes 2 regular beers when he does drink. No history of seizures or tremors. Denies recreational drug use.   Vaccination: Patient is vaccinated for COVID-19, 2 doses, Moderna 09/26/2019 and 10/26/2019.  ROS: Constitutional: no weight change, no fever ENT/Mouth: no sore throat, no rhinorrhea Eyes: no eye pain, no vision changes Cardiovascular: no chest pain, + dyspnea,  no edema, no palpitations Respiratory: + cough, no sputum, no wheezing Gastrointestinal: no nausea, no vomiting, no diarrhea, no constipation Genitourinary: no urinary incontinence, no dysuria, no hematuria Musculoskeletal: no arthralgias, no myalgias Skin: no skin lesions, no pruritus, Neuro: + weakness, no loss of consciousness, no syncope Psych: no anxiety, no depression, + decrease appetite Heme/Lymph: no bruising,  no bleeding  ED Course: Discussed with ED provider, patient requiring hospitalization due to shortness of breath secondary to asthma exacerbation.  Vitals in the ED was afebrile with temperature of 98.5, respiration rate increased at 29-33, heart rate 117, blood pressure 172/93, SPO2 98% on room air however requiring BiPAP and continuous nebulizer treatment. Labs in the ED was unremarkable.  Patient was treated with duo nebs x3, Xopenex nebulizers once, Solu-Medrol 125 mg IV  Assessment/Plan  Principal Problem:   Acute respiratory failure with hypoxia (HCC) Active Problems:   Acute asthma exacerbation   Acute hypoxemic respiratory failure (HCC)   Tobacco abuse   Acute hypoxic respiratory failure-suspect secondary to asthma exacerbation -DuoNebs scheduled every 6 hours -TSH checked was decreased at less than 0.01, compared to 5 years ago it was 0.025 -Checking free T4 -D-dimer, to rule out the need for CTA, was 0.29, low clinical suspicion now for pulmonary embolism -Xopenex nebulizer every 8 hours, 2 doses ordered starting 06/22/2020 -Solu-Medrol 80 mg daily IV  Asthma-resumed Brio Ellipta -Patient may benefit from pharmacy assistance  Tobacco abuse-nicotine patch ordered -Counseled cessation -Recommended with patient to call [phone] quit now on discharge to get free nicotine patches  TSH is significantly low - ordered free T4  Chart reviewed.   DVT prophylaxis: Enoxaparin 40 mg subcutaneous every 24 hours Code Status: Full code Diet: Heart healthy/carb modified Family Communication: No Disposition Plan: Pending clinical course Consults called: None at this time Admission status: Observation, medical surgical floor with telemetry  Past Medical History:  Diagnosis Date  . Asthma    History reviewed. No pertinent surgical history.  Social History:  reports that he has been smoking. He  has never used smokeless tobacco. He reports current alcohol use of about 6.0 standard  drinks of alcohol per week. He reports that he does not use drugs.  No Known Allergies Family History  Problem Relation Age of Onset  . Heart failure Neg Hx    Family history: Family history reviewed and not pertinent  Prior to Admission medications   Medication Sig Start Date End Date Taking? Authorizing Provider  acetaminophen (TYLENOL) 500 MG tablet Take 2 tablets (1,000 mg total) by mouth every 8 (eight) hours as needed for mild pain, moderate pain or headache. 09/04/19   Darlin Priestly, MD  albuterol (VENTOLIN HFA) 108 (90 Base) MCG/ACT inhaler Inhale 2 puffs into the lungs every 6 (six) hours as needed for wheezing or shortness of breath. Patient not taking: Reported on 11/19/2019 08/19/19   Enid Derry, PA-C  fluticasone furoate-vilanterol (BREO ELLIPTA) 100-25 MCG/INH AEPB Inhale 1 puff into the lungs daily. 11/22/19   Arnetha Courser, MD  nicotine (NICODERM CQ - DOSED IN MG/24 HOURS) 21 mg/24hr patch Place 1 patch (21 mg total) onto the skin daily. 11/21/19 11/20/20  Arnetha Courser, MD   Physical Exam: Vitals:   06/21/20 1830 06/21/20 1900 06/21/20 1930 06/21/20 2152  BP: (!) 149/77 (!) 131/55 (!) 137/58 (!) 159/79  Pulse: (!) 127 (!) 115 (!) 113 (!) 122  Resp: 20 (!) 29 (!) 25 (!) 21  Temp:      TempSrc:      SpO2: 95% 94% 95% 96%  Weight:      Height:       Constitutional: appears age-appropriate, NAD, calm, comfortable Eyes: PERRL, lids and conjunctivae normal ENMT: Mucous membranes are moist. Posterior pharynx clear of any exudate or lesions. Age-appropriate dentition. Hearing appropriate Neck: normal, supple, no masses, no thyromegaly Respiratory: Diffuse wheezing. Normal respiratory effort. Mild accessory muscle use.  Cardiovascular: Regular rate and rhythm, no murmurs / rubs / gallops. No extremity edema. 2+ pedal pulses. No carotid bruits.  Abdomen: no tenderness, no masses palpated, no hepatosplenomegaly. Bowel sounds positive.  Musculoskeletal: no clubbing / cyanosis. No  joint deformity upper and lower extremities. Good ROM, no contractures, no atrophy. Normal muscle tone.  Skin: no rashes, lesions, ulcers. No induration Neurologic: Sensation intact. Strength 5/5 in all 4.  Psychiatric: Normal judgment and insight. Alert and oriented x 3. Normal mood.   EKG: independently reviewed, showing sinus tachycardia with rate of 108, QTc 430  Chest x-ray on Admission: I personally reviewed and I agree with radiologist reading as below.  DG Chest Portable 1 View  Result Date: 06/21/2020 CLINICAL DATA:  Dyspnea EXAM: PORTABLE CHEST 1 VIEW COMPARISON:  11/19/2019 FINDINGS: The heart size and mediastinal contours are within normal limits. Both lungs are clear. The visualized skeletal structures are unremarkable. IMPRESSION: No active disease. Electronically Signed   By: Alcide Clever M.D.   On: 06/21/2020 16:04   Labs on Admission: I have personally reviewed following labs  CBC: Recent Labs  Lab 06/21/20 1536  WBC 9.2  HGB 16.5  HCT 47.0  MCV 86.4  PLT 172   Basic Metabolic Panel: Recent Labs  Lab 06/21/20 1536  NA 135  K 3.7  CL 101  CO2 24  GLUCOSE 110*  BUN 14  CREATININE 0.78  CALCIUM 9.0   GFR: Estimated Creatinine Clearance: 114.5 mL/min (by C-G formula based on SCr of 0.78 mg/dL).  Thyroid Function Tests: Recent Labs    06/21/20 1536  TSH <0.010*   Urine analysis:    Component  Value Date/Time   COLORURINE YELLOW (A) 07/13/2018 2223   APPEARANCEUR CLEAR (A) 07/13/2018 2223   LABSPEC 1.020 07/13/2018 2223   PHURINE 6.0 07/13/2018 2223   GLUCOSEU NEGATIVE 07/13/2018 2223   HGBUR SMALL (A) 07/13/2018 2223   BILIRUBINUR NEGATIVE 07/13/2018 2223   KETONESUR NEGATIVE 07/13/2018 2223   PROTEINUR NEGATIVE 07/13/2018 2223   NITRITE NEGATIVE 07/13/2018 2223   LEUKOCYTESUR NEGATIVE 07/13/2018 2223   Maricruz Lucero N Stewart Sasaki D.O. [facility]  If 7PM-7AM, please contact overnight-coverage provider If 7AM-7PM, please contact day coverage  provider [email or web address]  06/21/2020, 11:02 PM

## 2020-06-22 ENCOUNTER — Encounter: Payer: Self-pay | Admitting: Internal Medicine

## 2020-06-22 DIAGNOSIS — Z79899 Other long term (current) drug therapy: Secondary | ICD-10-CM | POA: Diagnosis not present

## 2020-06-22 DIAGNOSIS — J9601 Acute respiratory failure with hypoxia: Secondary | ICD-10-CM | POA: Diagnosis present

## 2020-06-22 DIAGNOSIS — E0590 Thyrotoxicosis, unspecified without thyrotoxic crisis or storm: Secondary | ICD-10-CM

## 2020-06-22 DIAGNOSIS — J45901 Unspecified asthma with (acute) exacerbation: Secondary | ICD-10-CM | POA: Diagnosis not present

## 2020-06-22 DIAGNOSIS — J4551 Severe persistent asthma with (acute) exacerbation: Secondary | ICD-10-CM | POA: Diagnosis present

## 2020-06-22 DIAGNOSIS — Z20822 Contact with and (suspected) exposure to covid-19: Secondary | ICD-10-CM | POA: Diagnosis present

## 2020-06-22 DIAGNOSIS — F17210 Nicotine dependence, cigarettes, uncomplicated: Secondary | ICD-10-CM | POA: Diagnosis present

## 2020-06-22 DIAGNOSIS — Z23 Encounter for immunization: Secondary | ICD-10-CM | POA: Diagnosis not present

## 2020-06-22 LAB — CBC
HCT: 41.6 % (ref 39.0–52.0)
HCT: 43.8 % (ref 39.0–52.0)
Hemoglobin: 14.9 g/dL (ref 13.0–17.0)
Hemoglobin: 15.2 g/dL (ref 13.0–17.0)
MCH: 30.5 pg (ref 26.0–34.0)
MCH: 30 pg (ref 26.0–34.0)
MCHC: 35.8 g/dL (ref 30.0–36.0)
MCHC: 34.7 g/dL (ref 30.0–36.0)
MCV: 85.2 fL (ref 80.0–100.0)
MCV: 86.4 fL (ref 80.0–100.0)
Platelets: 170 10*3/uL (ref 150–400)
Platelets: 176 10*3/uL (ref 150–400)
RBC: 4.88 MIL/uL (ref 4.22–5.81)
RBC: 5.07 MIL/uL (ref 4.22–5.81)
RDW: 12.1 % (ref 11.5–15.5)
RDW: 12.6 % (ref 11.5–15.5)
WBC: 6.3 10*3/uL (ref 4.0–10.5)
WBC: 13.1 10*3/uL — ABNORMAL HIGH (ref 4.0–10.5)
nRBC: 0 % (ref 0.0–0.2)
nRBC: 0 % (ref 0.0–0.2)

## 2020-06-22 LAB — T4, FREE: Free T4: 2.23 ng/dL — ABNORMAL HIGH (ref 0.61–1.12)

## 2020-06-22 LAB — BASIC METABOLIC PANEL
Anion gap: 9 (ref 5–15)
BUN: 17 mg/dL (ref 6–20)
CO2: 21 mmol/L — ABNORMAL LOW (ref 22–32)
Calcium: 9.1 mg/dL (ref 8.9–10.3)
Chloride: 103 mmol/L (ref 98–111)
Creatinine, Ser: 0.87 mg/dL (ref 0.61–1.24)
GFR, Estimated: >60 mL/min (ref >60)
Glucose, Bld: 221 mg/dL — ABNORMAL HIGH (ref 70–99)
Potassium: 4.4 mmol/L (ref 3.5–5.1)
Sodium: 133 mmol/L — ABNORMAL LOW (ref 135–145)

## 2020-06-22 LAB — HEMOGLOBIN A1C
Hgb A1c MFr Bld: 5.6 % (ref 4.8–5.6)
Mean Plasma Glucose: 114.02 mg/dL

## 2020-06-22 MED ORDER — FOLIC ACID 1 MG PO TABS
1.0000 mg | ORAL_TABLET | Freq: Every day | ORAL | Status: DC
  Administered 2020-06-22 – 2020-06-24 (×3): 1 mg via ORAL
  Filled 2020-06-22 (×3): qty 1

## 2020-06-22 MED ORDER — LORAZEPAM 2 MG/ML IJ SOLN
1.0000 mg | INTRAMUSCULAR | Status: DC | PRN
  Administered 2020-06-22: 1 mg via INTRAVENOUS
  Filled 2020-06-22: qty 1

## 2020-06-22 MED ORDER — METHIMAZOLE 5 MG PO TABS
5.0000 mg | ORAL_TABLET | Freq: Three times a day (TID) | ORAL | Status: DC
  Administered 2020-06-22 – 2020-06-24 (×6): 5 mg via ORAL
  Filled 2020-06-22 (×11): qty 1

## 2020-06-22 MED ORDER — GUAIFENESIN ER 600 MG PO TB12
1200.0000 mg | ORAL_TABLET | Freq: Two times a day (BID) | ORAL | Status: DC
  Administered 2020-06-22 – 2020-06-24 (×5): 1200 mg via ORAL
  Filled 2020-06-22 (×5): qty 2

## 2020-06-22 MED ORDER — PNEUMOCOCCAL VAC POLYVALENT 25 MCG/0.5ML IJ INJ
0.5000 mL | INJECTION | INTRAMUSCULAR | Status: AC
  Administered 2020-06-24: 0.5 mL via INTRAMUSCULAR
  Filled 2020-06-22: qty 0.5

## 2020-06-22 MED ORDER — LORAZEPAM 1 MG PO TABS
1.0000 mg | ORAL_TABLET | ORAL | Status: DC | PRN
Start: 2020-06-22 — End: 2020-06-24

## 2020-06-22 MED ORDER — ADULT MULTIVITAMIN W/MINERALS CH
1.0000 | ORAL_TABLET | Freq: Every day | ORAL | Status: DC
  Administered 2020-06-22 – 2020-06-24 (×3): 1 via ORAL
  Filled 2020-06-22 (×3): qty 1

## 2020-06-22 MED ORDER — THIAMINE HCL 100 MG PO TABS
100.0000 mg | ORAL_TABLET | Freq: Every day | ORAL | Status: DC
  Administered 2020-06-22 – 2020-06-24 (×3): 100 mg via ORAL
  Filled 2020-06-22 (×3): qty 1

## 2020-06-22 MED ORDER — SALINE SPRAY 0.65 % NA SOLN
2.0000 | NASAL | Status: DC | PRN
  Filled 2020-06-22: qty 44

## 2020-06-22 MED ORDER — GUAIFENESIN-CODEINE 100-10 MG/5ML PO SOLN: 10.0000 mL | ORAL | Status: DC | PRN

## 2020-06-22 NOTE — ED Notes (Signed)
Pt states he feels better after nebulizing treatment and it is easier to breathing

## 2020-06-22 NOTE — ED Notes (Signed)
Pt work of breathing decreased at this time. Nebulizer treatment completed. Pt encouraged to slow breathing. 2L nasal cannula in place. Pt 96 percent on 2L.

## 2020-06-22 NOTE — ED Notes (Signed)
Pt called out using call bell. Pt sitting up on side of the bed with increased work of breathing. Pt requesting to have nebulizer treatment at this time. Pt refuses offer of inhaler. Nebulizer given.

## 2020-06-22 NOTE — Progress Notes (Signed)
Patient admitted from ED with SOB, tachypneic, otherwise VSS; inspiratory wheezing on auscultation throughout.  Respiratory therapist en route for nebulizing treatment.  Patient using Incentive Spirometry.  A+Ox4.  Insurance claims handler. Assessment completed. Call bell and needs within reach.  Will continue to monitor and assess with POC.

## 2020-06-22 NOTE — Progress Notes (Addendum)
Progress Note    Derrick Williamson  KKX:[id number] DOB: Aug 23, 1970  DOA: 06/21/2020 PCP: Patient, No Pcp Per      Brief Narrative:    Medical records reviewed and are as summarized below:  Jereme Loren is a 50 y.o. male with medical history significant for asthma, tobacco use disorder, alcohol use disorder, presented to the hospital with cough, shortness of breath and wheezing.  He was admitted for acute asthma exacerbation.  Incidentally, he was found to have primary hyperthyroidism.  Chart review showed that patient has a history of hyperthyroidism.      Assessment/Plan:   Principal Problem:   Acute asthma exacerbation Active Problems:   Acute respiratory failure with hypoxia (HCC)   Acute hypoxemic respiratory failure (HCC)   Tobacco abuse   Hyperthyroidism    Body mass index is 29.05 kg/m.    Acute asthma exacerbation: Continue IV steroids and bronchodilators.  Acute hypoxic respiratory failure: Taper off oxygen as able.  .  Primary hyperthyroidism: TSH less than 0.010 FT4 2.23.  In August 2016, TSH was 0.025 and FT4 was greater than 5.5 Management of hyperthyroidism was discussed.  Side effects of methimazole were discussed as well.  Start methimazole.  Use of beta-blockers is not ideal at this time because of acute asthma exacerbation.  Outpatient follow-up with endocrinologist recommended.  Alcohol use disorder: Placed on Ativan as needed per CIWA protocol.  Start thiamine and multivitamins.  Monitor electrolytes.  Tobacco use disorder: Counseled to quit smoking cigarettes.   Diet Order            Diet heart healthy/carb modified Room service appropriate? Yes; Fluid consistency: Thin  Diet effective now                    Consultants:  None  Procedures:  None    Medications:   . enoxaparin (LOVENOX) injection  40 mg Subcutaneous Q24H  . fluticasone furoate-vilanterol  1 puff Inhalation Daily  . guaiFENesin  1,200 mg Oral BID  .  ipratropium-albuterol  3 mL Nebulization Q6H  . levalbuterol  1.25 mg Nebulization Q8H  . methimazole  5 mg Oral TID  . methylPREDNISolone (SOLU-MEDROL) injection  80 mg Intravenous Daily  . nicotine  21 mg Transdermal Q24H   Continuous Infusions:   Anti-infectives (From admission, onward)   None             Family Communication/Anticipated D/C date and plan/Code Status   DVT prophylaxis: enoxaparin (LOVENOX) injection 40 mg Start: 06/21/20 2200 Place TED hose Start: 06/21/20 1713     Code Status: Full Code  Family Communication: None Disposition Plan:    Status is: Observation  The patient will require care spanning > 2 midnights and should be moved to inpatient because: IV treatments appropriate due to intensity of illness or inability to take PO and Inpatient level of care appropriate due to severity of illness  Dispo: The patient is from: Home              Anticipated d/c is to: Home              Patient currently is not medically stable to d/c.   Difficult to place patient No           Subjective:   C/o shortness of breath and wheezing  Objective:    Vitals:   06/22/20 0633 06/22/20 0800 06/22/20 1101 06/22/20 1154  BP:  (!) 143/70 (!) 146/80 140/71  Pulse: Marland Kitchen)  107 83 97 95  Resp: (!) 35 (!) 26 (!) 36 (!) 24  Temp:    98.1 F (36.7 C)  TempSrc:    Oral  SpO2: 95% 98% 94% 97%  Weight:      Height:       No data found.  No intake or output data in the 24 hours ending 06/22/20 1247 Filed Weights   06/21/20 1539  Weight: 81.6 kg    Exam:  GEN: NAD SKIN: No rash EYES: EOMI ENT: MMM CV: Regular rate and rhythm, tachycardic PULM: Decreased air entry bilaterally anterior bilateral expiratory wheezing.  No rales heard. ABD: soft, ND, NT, +BS CNS: AAO x 3, non focal EXT: No edema or tenderness        Data Reviewed:   I have personally reviewed following labs and imaging studies:  Labs: Labs show the following:   Basic  Metabolic Panel: Recent Labs  Lab 06/21/20 1536 06/22/20 0226  NA 135 133*  K 3.7 4.4  CL 101 103  CO2 24 21*  GLUCOSE 110* 221*  BUN 14 17  CREATININE 0.78 0.87  CALCIUM 9.0 9.1   GFR Estimated Creatinine Clearance: 105.3 mL/min (by C-G formula based on SCr of 0.87 mg/dL). Liver Function Tests: No results for input(s): AST, ALT, ALKPHOS, BILITOT, PROT, ALBUMIN in the last 168 hours. No results for input(s): LIPASE, AMYLASE in the last 168 hours. No results for input(s): AMMONIA in the last 168 hours. Coagulation profile No results for input(s): INR, PROTIME in the last 168 hours.  CBC: Recent Labs  Lab 06/21/20 1536 06/22/20 0226  WBC 9.2 6.3  HGB 16.5 14.9  HCT 47.0 41.6  MCV 86.4 85.2  PLT 172 170   Cardiac Enzymes: No results for input(s): CKTOTAL, CKMB, CKMBINDEX, TROPONINI in the last 168 hours. BNP (last 3 results) No results for input(s): PROBNP in the last 8760 hours. CBG: No results for input(s): GLUCAP in the last 168 hours. D-Dimer: Recent Labs    06/21/20 1536  DDIMER 0.29   Hgb A1c: No results for input(s): HGBA1C in the last 72 hours. Lipid Profile: No results for input(s): CHOL, HDL, LDLCALC, TRIG, CHOLHDL, LDLDIRECT in the last 72 hours. Thyroid function studies: Recent Labs    06/21/20 1536  TSH <0.010*   Anemia work up: No results for input(s): VITAMINB12, FOLATE, FERRITIN, TIBC, IRON, RETICCTPCT in the last 72 hours. Sepsis Labs: Recent Labs  Lab 06/21/20 1536 06/22/20 0226  WBC 9.2 6.3    Microbiology Recent Results (from the past 240 hour(s))  Resp Panel by RT-PCR (Flu A&B, Covid) Nasopharyngeal Swab     Status: None   Collection Time: 06/21/20  5:01 PM   Specimen: Nasopharyngeal Swab; Nasopharyngeal(NP) swabs in vial transport medium  Result Value Ref Range Status   SARS Coronavirus 2 by RT PCR NEGATIVE NEGATIVE Final    Comment: (NOTE) SARS-CoV-2 target nucleic acids are NOT DETECTED.  The SARS-CoV-2 RNA is generally  detectable in upper respiratory specimens during the acute phase of infection. The lowest concentration of SARS-CoV-2 viral copies this assay can detect is 138 copies/mL. A negative result does not preclude SARS-Cov-2 infection and should not be used as the sole basis for treatment or other patient management decisions. A negative result may occur with  improper specimen collection/handling, submission of specimen other than nasopharyngeal swab, presence of viral mutation(s) within the areas targeted by this assay, and inadequate number of viral copies(<138 copies/mL). A negative result must be combined with clinical  observations, patient history, and epidemiological information. The expected result is Negative.  Fact Sheet for Patients:  BloggerCourse.com  Fact Sheet for Healthcare Providers:  SeriousBroker.it  This test is no t yet approved or cleared by the [place] FDA and  has been authorized for detection and/or diagnosis of SARS-CoV-2 by FDA under an Emergency Use Authorization (EUA). This EUA will remain  in effect (meaning this test can be used) for the duration of the COVID-19 declaration under Section 564(b)(1) of the Act, 21 U.S.C.section 360bbb-3(b)(1), unless the authorization is terminated  or revoked sooner.       Influenza A by PCR NEGATIVE NEGATIVE Final   Influenza B by PCR NEGATIVE NEGATIVE Final    Comment: (NOTE) The Xpert Xpress SARS-CoV-2/FLU/RSV plus assay is intended as an aid in the diagnosis of influenza from Nasopharyngeal swab specimens and should not be used as a sole basis for treatment. Nasal washings and aspirates are unacceptable for Xpert Xpress SARS-CoV-2/FLU/RSV testing.  Fact Sheet for Patients: BloggerCourse.com  Fact Sheet for Healthcare Providers: SeriousBroker.it  This test is not yet approved or cleared by the [place] FDA  and has been authorized for detection and/or diagnosis of SARS-CoV-2 by FDA under an Emergency Use Authorization (EUA). This EUA will remain in effect (meaning this test can be used) for the duration of the COVID-19 declaration under Section 564(b)(1) of the Act, 21 U.S.C. section 360bbb-3(b)(1), unless the authorization is terminated or revoked.  Performed at [facility], [address] [place], [place] [address]     Procedures and diagnostic studies:  DG Chest Portable 1 View  Result Date: 06/21/2020 CLINICAL DATA:  Dyspnea EXAM: PORTABLE CHEST 1 VIEW COMPARISON:  11/19/2019 FINDINGS: The heart size and mediastinal contours are within normal limits. Both lungs are clear. The visualized skeletal structures are unremarkable. IMPRESSION: No active disease. Electronically Signed   By: Alcide Clever M.D.   On: 06/21/2020 16:04               LOS: 0 days   Zahlia Deshazer  [facility]   Pager on [email or web address]. If 7PM-7AM, please contact night-coverage at [email or web address]     06/22/2020, 12:47 PM

## 2020-06-22 NOTE — ED Notes (Signed)
Pt no longer coughing. Pt relaxing and lying back in bed. Oxygen saturation 96 percent on 2L at this time.

## 2020-06-22 NOTE — ED Notes (Signed)
Pt resting comfortably this time. Pt is currently sleeping with eyes closed, normal rise and fall of chest. NAD noted. Call bell in reach.

## 2020-06-22 NOTE — ED Notes (Addendum)
While this RN at bedside for lab draw, pt began to have coughing fit. Pt oxygen saturation decreased to 88-89 percent following coughing. Oxygen increased to 4L [place]. Pt given albuterol inhaler per request. Pt also requesting medication to help with cough. Jon Billings NP notified of request. Awaiting response at this time.

## 2020-06-22 NOTE — Plan of Care (Signed)
  Problem: Health Behavior/Discharge Planning: Goal: Ability to manage health-related needs will improve Outcome: Progressing   Problem: Clinical Measurements: Goal: Ability to maintain clinical measurements within normal limits will improve Outcome: Progressing Goal: Will remain free from infection Outcome: Progressing Goal: Diagnostic test results will improve Outcome: Progressing Goal: Respiratory complications will improve Outcome: Progressing   Problem: Coping: Goal: Level of anxiety will decrease Outcome: Progressing   Problem: Elimination: Goal: Will not experience complications related to bowel motility Outcome: Progressing Goal: Will not experience complications related to urinary retention Outcome: Progressing   Problem: Safety: Goal: Ability to remain free from injury will improve Outcome: Progressing   

## 2020-06-22 NOTE — ED Notes (Signed)
Pt is stating he feels SOB and his chest is really tight. PT notes he cant breath and feels like their is flem in his chest Pt is hyperventilating at this time. NP morrison notified via secure at this time

## 2020-06-23 LAB — BASIC METABOLIC PANEL
Anion gap: 7 (ref 5–15)
BUN: 20 mg/dL (ref 6–20)
CO2: 24 mmol/L (ref 22–32)
Calcium: 8.9 mg/dL (ref 8.9–10.3)
Chloride: 105 mmol/L (ref 98–111)
Creatinine, Ser: 0.64 mg/dL (ref 0.61–1.24)
GFR, Estimated: >60 mL/min (ref >60)
Glucose, Bld: 125 mg/dL — ABNORMAL HIGH (ref 70–99)
Potassium: 4.4 mmol/L (ref 3.5–5.1)
Sodium: 136 mmol/L (ref 135–145)

## 2020-06-23 LAB — MAGNESIUM: Magnesium: 2.1 mg/dL (ref 1.7–2.4)

## 2020-06-23 LAB — PHOSPHORUS: Phosphorus: 4.2 mg/dL (ref 2.5–4.6)

## 2020-06-23 LAB — T3, FREE: T3, Free: 7.1 pg/mL — ABNORMAL HIGH (ref 2.0–4.4)

## 2020-06-23 MED ORDER — COVID-19 MRNA VACC (MODERNA) 50 MCG/0.25ML IM SUSP
0.2500 mL | Freq: Once | INTRAMUSCULAR | Status: DC
  Filled 2020-06-23: qty 0.25

## 2020-06-23 MED ORDER — METHYLPREDNISOLONE SODIUM SUCC 125 MG IJ SOLR
80.0000 mg | Freq: Four times a day (QID) | INTRAMUSCULAR | Status: AC
  Administered 2020-06-23 (×2): 80 mg via INTRAVENOUS
  Filled 2020-06-23 (×2): qty 2

## 2020-06-23 MED ORDER — GUAIFENESIN ER 600 MG PO TB12: 600.0000 mg | ORAL_TABLET | Freq: Two times a day (BID) | ORAL | Status: DC

## 2020-06-23 MED ORDER — DM-GUAIFENESIN ER 30-600 MG PO TB12
1.0000 | ORAL_TABLET | Freq: Two times a day (BID) | ORAL | Status: DC | PRN
  Administered 2020-06-23: 17:00:00 1 via ORAL
  Filled 2020-06-23: qty 1

## 2020-06-23 NOTE — TOC Initial Note (Signed)
Transition of Care [facility]) - Initial/Assessment Note    Patient Details  Name: Derrick Williamson MRN: [id number] Date of Birth: [birth year]  [facility]) CM/SW Contact:    Shelbie Hutching, RN Phone Number: 06/23/2020, 9:56 AM  Clinical Narrative:                 Patient admitted to the hospital with asthma exacerbation.  TOC received consult for substance abuse resources.  RNCM met with patient at the bedside.  Patient reports that he is from home and lives with his girlfriend, he drives and works.  Patient denies any problems with alcohol, he reports that he does drink some at home but not excessively and he does not feel that he has a problem.  RNCM did leave resources at the bedside for patient if he chooses to look over them.   Expected Discharge Plan: Home/Self Care Barriers to Discharge: Continued Medical Work up   Patient Goals and CMS Choice Patient states their goals for this hospitalization and ongoing recovery are:: still says he is having trouble breathing and feels tight.  Wants to be able to breath better and go back to work      Expected Discharge Plan and Services Expected Discharge Plan: Home/Self Care   Discharge Planning Services: CM Consult   Living arrangements for the past 2 months: Single Family Home                 DME Arranged: N/A DME Agency: NA       HH Arranged: NA          Prior Living Arrangements/Services Living arrangements for the past 2 months: Single Family Home Lives with:: Significant Other Patient language and need for interpreter reviewed:: Yes (Spanish but does speak and understand some English) Do you feel safe going back to the place where you live?: Yes      Need for Family Participation in Patient Care: No (Comment) Care giver support system in place?: Yes (comment) (girlfriend)   Criminal Activity/Legal Involvement Pertinent to Current Situation/Hospitalization: No - Comment as needed  Activities of Daily Living Home  Assistive Devices/Equipment: None ADL Screening (condition at time of admission) Patient's cognitive ability adequate to safely complete daily activities?: No Is the patient deaf or have difficulty hearing?: No Does the patient have difficulty seeing, even when wearing glasses/contacts?: No Does the patient have difficulty concentrating, remembering, or making decisions?: No Patient able to express need for assistance with ADLs?: No Does the patient have difficulty dressing or bathing?: No Independently performs ADLs?: Yes (appropriate for developmental age) Does the patient have difficulty walking or climbing stairs?: Yes (because of SOB.  Otherwise no difficulty.) Weakness of Legs: None Weakness of Arms/Hands: None  Permission Sought/Granted Permission sought to share information with : Case Manager,Family Supports Permission granted to share information with : Yes, Verbal Permission Granted  Share Information with NAME: Lorriane Shire     Permission granted to share info w Relationship: girlfriend     Emotional Assessment Appearance:: Appears stated age Attitude/Demeanor/Rapport: Engaged Affect (typically observed): Accepting Orientation: : Oriented to Self,Oriented to Place,Oriented to  Time,Oriented to Situation Alcohol / Substance Use: Not Applicable Psych Involvement: No (comment)  Admission diagnosis:  Severe persistent asthma with exacerbation [J45.51] Acute asthma exacerbation [J45.901] Acute hypoxemic respiratory failure ([place]) [J96.01] Patient Active Problem List   Diagnosis Date Noted  . Hyperthyroidism 06/22/2020  . Acute hypoxemic respiratory failure ([place]) 06/21/2020  . Tobacco abuse 06/21/2020  . Asthma exacerbation attacks  11/20/2019  . Asthma exacerbation 09/03/2019  . Acute respiratory failure with hypoxia ([place]) 09/02/2019  . Acute asthma exacerbation 09/02/2019  . Left sided chest pain 12/01/2014  . Community acquired pneumonia 12/01/2014   PCP:  Patient, No  Pcp Per Pharmacy:   [id number]Phone: [phone]: [phone] [id number]: [phone]: [phone]    Social Determinants of Health (SDOH) Interventions    Readmission Risk Interventions No flowsheet data found.

## 2020-06-23 NOTE — Progress Notes (Signed)
Progress Note    Derrick Williamson  PIR:[id number] DOB: [birth year]  DOA: 06/21/2020 PCP: Patient, No Pcp Per      Brief Narrative:    Medical records reviewed and are as summarized below:  Derrick Williamson is a 50 y.o. male with medical history significant for asthma, tobacco use disorder, alcohol use disorder, presented to the hospital with cough, shortness of breath and wheezing.  He was admitted for acute asthma exacerbation.  Incidentally, he was found to have primary hyperthyroidism.  Chart review showed that patient has a history of hyperthyroidism.      Assessment/Plan:   Principal Problem:   Acute asthma exacerbation Active Problems:   Acute respiratory failure with hypoxia (HCC)   Acute hypoxemic respiratory failure (HCC)   Tobacco abuse   Hyperthyroidism    Body mass index is 29.05 kg/m.    Acute asthma exacerbation: Continue IV steroids and bronchodilators.  He said he does not have a nebulizer at home so will be given prescription for nebulizer.  Acute hypoxic respiratory failure: Improved.  She is tolerating room air..  Primary hyperthyroidism: TSH less than 0.010 FT4 2.23.  In August 2016, TSH was 0.025 and FT4 was greater than 5.5 Continue methimazole.  Beta-blockers will be avoided for now because of asthma exacerbation.  Outpatient follow-up with endocrinologist recommended.  Alcohol use disorder: Ativan as needed per CIWA protocol.  Continue multivitamins.   Tobacco use disorder: Counseled to quit smoking cigarettes.  Patient requested booster dose of Moderna COVID vaccine.  He said he had 1st dose on 6/5/2021and 2nd dose on 7/52021.  Booster dose has been ordered for tomorrow.  Possible discharge home tomorrow.   Diet Order            Diet heart healthy/carb modified Room service appropriate? Yes; Fluid consistency: Thin  Diet effective now                    Consultants:  None  Procedures:  None    Medications:   . [START ON  06/24/2020] COVID-19 mRNA vaccine (Moderna)  0.25 mL Intramuscular Once  . enoxaparin (LOVENOX) injection  40 mg Subcutaneous Q24H  . fluticasone furoate-vilanterol  1 puff Inhalation Daily  . folic acid  1 mg Oral Daily  . guaiFENesin  1,200 mg Oral BID  . ipratropium-albuterol  3 mL Nebulization Q6H  . methimazole  5 mg Oral TID  . methylPREDNISolone (SOLU-MEDROL) injection  80 mg Intravenous Q6H  . multivitamin with minerals  1 tablet Oral Daily  . nicotine  21 mg Transdermal Q24H  . pneumococcal 23 valent vaccine  0.5 mL Intramuscular Tomorrow-1000  . thiamine  100 mg Oral Daily   Continuous Infusions:   Anti-infectives (From admission, onward)   None             Family Communication/Anticipated D/C date and plan/Code Status   DVT prophylaxis: enoxaparin (LOVENOX) injection 40 mg Start: 06/21/20 2200 Place TED hose Start: 06/21/20 1713     Code Status: Full Code  Family Communication: None Disposition Plan:    Status is: Observation  The patient will require care spanning > 2 midnights and should be moved to inpatient because: IV treatments appropriate due to intensity of illness or inability to take PO and Inpatient level of care appropriate due to severity of illness  Dispo: The patient is from: Home              Anticipated d/c is to: Home  Patient currently is not medically stable to d/c.   Difficult to place patient No           Subjective:   C/or shortness of breath, cough, chest tightness or wheezing.  He requested booster dose of Moderna Covid vaccine.  Objective:    Vitals:   06/23/20 0620 06/23/20 0759 06/23/20 0833 06/23/20 1113  BP: 126/67  128/77 (!) 145/85  Pulse: 88  74 93  Resp: (!) 22  18 16   Temp: 98 F (36.7 C)  98.6 F (37 C) 98.1 F (36.7 C)  TempSrc: Oral     SpO2: 95% 93% 95% 98%  Weight:      Height:       No data found.   Intake/Output Summary (Last 24 hours) at 06/23/2020 1117 Last data filed at  06/23/2020 1046 Gross per 24 hour  Intake 120 ml  Output --  Net 120 ml   Filed Weights   06/21/20 1539  Weight: 81.6 kg    Exam:  GEN: NAD SKIN: No rash EYES: EOMI ENT: MMM CV: RRR, tachycardic PULM: Decreased air entry bilaterally, bilateral expiratory wheezing.  No rales heard. ABD: soft, ND, NT, +BS CNS: AAO x 3, non focal EXT: No edema or tenderness        Data Reviewed:   I have personally reviewed following labs and imaging studies:  Labs: Labs show the following:   Basic Metabolic Panel: Recent Labs  Lab 06/21/20 1536 06/22/20 0226 06/23/20 0458  NA 135 133* 136  K 3.7 4.4 4.4  CL 101 103 105  CO2 24 21* 24  GLUCOSE 110* 221* 125*  BUN 14 17 20   CREATININE 0.78 0.87 0.64  CALCIUM 9.0 9.1 8.9  MG  --   --  2.1  PHOS  --   --  4.2   GFR Estimated Creatinine Clearance: 114.5 mL/min (by C-G formula based on SCr of 0.64 mg/dL). Liver Function Tests: No results for input(s): AST, ALT, ALKPHOS, BILITOT, PROT, ALBUMIN in the last 168 hours. No results for input(s): LIPASE, AMYLASE in the last 168 hours. No results for input(s): AMMONIA in the last 168 hours. Coagulation profile No results for input(s): INR, PROTIME in the last 168 hours.  CBC: Recent Labs  Lab 06/21/20 1536 06/22/20 0226 06/22/20 1741  WBC 9.2 6.3 13.1*  HGB 16.5 14.9 15.2  HCT 47.0 41.6 43.8  MCV 86.4 85.2 86.4  PLT 172 170 176   Cardiac Enzymes: No results for input(s): CKTOTAL, CKMB, CKMBINDEX, TROPONINI in the last 168 hours. BNP (last 3 results) No results for input(s): PROBNP in the last 8760 hours. CBG: No results for input(s): GLUCAP in the last 168 hours. D-Dimer: Recent Labs    06/21/20 1536  DDIMER 0.29   Hgb A1c: Recent Labs    06/22/20 0226  HGBA1C 5.6   Lipid Profile: No results for input(s): CHOL, HDL, LDLCALC, TRIG, CHOLHDL, LDLDIRECT in the last 72 hours. Thyroid function studies: Recent Labs    06/21/20 1536  TSH <0.010*  T3FREE 7.1*    Anemia work up: No results for input(s): VITAMINB12, FOLATE, FERRITIN, TIBC, IRON, RETICCTPCT in the last 72 hours. Sepsis Labs: Recent Labs  Lab 06/21/20 1536 06/22/20 0226 06/22/20 1741  WBC 9.2 6.3 13.1*    Microbiology Recent Results (from the past 240 hour(s))  Resp Panel by RT-PCR (Flu A&B, Covid) Nasopharyngeal Swab     Status: None   Collection Time: 06/21/20  5:01 PM   Specimen: Nasopharyngeal Swab;  Nasopharyngeal(NP) swabs in vial transport medium  Result Value Ref Range Status   SARS Coronavirus 2 by RT PCR NEGATIVE NEGATIVE Final    Comment: (NOTE) SARS-CoV-2 target nucleic acids are NOT DETECTED.  The SARS-CoV-2 RNA is generally detectable in upper respiratory specimens during the acute phase of infection. The lowest concentration of SARS-CoV-2 viral copies this assay can detect is 138 copies/mL. A negative result does not preclude SARS-Cov-2 infection and should not be used as the sole basis for treatment or other patient management decisions. A negative result may occur with  improper specimen collection/handling, submission of specimen other than nasopharyngeal swab, presence of viral mutation(s) within the areas targeted by this assay, and inadequate number of viral copies(<138 copies/mL). A negative result must be combined with clinical observations, patient history, and epidemiological information. The expected result is Negative.  Fact Sheet for Patients:  BloggerCourse.com  Fact Sheet for Healthcare Providers:  SeriousBroker.it  This test is no t yet approved or cleared by the [place] FDA and  has been authorized for detection and/or diagnosis of SARS-CoV-2 by FDA under an Emergency Use Authorization (EUA). This EUA will remain  in effect (meaning this test can be used) for the duration of the COVID-19 declaration under Section 564(b)(1) of the Act, 21 U.S.C.section 360bbb-3(b)(1), unless the  authorization is terminated  or revoked sooner.       Influenza A by PCR NEGATIVE NEGATIVE Final   Influenza B by PCR NEGATIVE NEGATIVE Final    Comment: (NOTE) The Xpert Xpress SARS-CoV-2/FLU/RSV plus assay is intended as an aid in the diagnosis of influenza from Nasopharyngeal swab specimens and should not be used as a sole basis for treatment. Nasal washings and aspirates are unacceptable for Xpert Xpress SARS-CoV-2/FLU/RSV testing.  Fact Sheet for Patients: BloggerCourse.com  Fact Sheet for Healthcare Providers: SeriousBroker.it  This test is not yet approved or cleared by the [place] FDA and has been authorized for detection and/or diagnosis of SARS-CoV-2 by FDA under an Emergency Use Authorization (EUA). This EUA will remain in effect (meaning this test can be used) for the duration of the COVID-19 declaration under Section 564(b)(1) of the Act, 21 U.S.C. section 360bbb-3(b)(1), unless the authorization is terminated or revoked.  Performed at [facility], [address] [place], [place] [address]     Procedures and diagnostic studies:  DG Chest Portable 1 View  Result Date: 06/21/2020 CLINICAL DATA:  Dyspnea EXAM: PORTABLE CHEST 1 VIEW COMPARISON:  11/19/2019 FINDINGS: The heart size and mediastinal contours are within normal limits. Both lungs are clear. The visualized skeletal structures are unremarkable. IMPRESSION: No active disease. Electronically Signed   By: Alcide Clever M.D.   On: 06/21/2020 16:04               LOS: 1 day   Quantavius Humm  [facility]   Pager on [email or web address]. If 7PM-7AM, please contact night-coverage at [email or web address]     06/23/2020, 11:17 AM

## 2020-06-23 NOTE — Plan of Care (Signed)
  Problem: Health Behavior/Discharge Planning: Goal: Ability to manage health-related needs will improve Outcome: Progressing   Problem: Clinical Measurements: Goal: Ability to maintain clinical measurements within normal limits will improve Outcome: Progressing Goal: Will remain free from infection Outcome: Progressing Goal: Diagnostic test results will improve Outcome: Progressing Goal: Respiratory complications will improve Outcome: Progressing   Problem: Coping: Goal: Level of anxiety will decrease Outcome: Progressing   Problem: Elimination: Goal: Will not experience complications related to bowel motility Outcome: Progressing Goal: Will not experience complications related to urinary retention Outcome: Progressing   Problem: Safety: Goal: Ability to remain free from injury will improve Outcome: Progressing

## 2020-06-23 NOTE — Plan of Care (Signed)
  Problem: Health Behavior/Discharge Planning: Goal: Ability to manage health-related needs will improve Outcome: Progressing   

## 2020-06-24 MED ORDER — ALBUTEROL SULFATE HFA 108 (90 BASE) MCG/ACT IN AERS: 2.0000 | INHALATION_SPRAY | Freq: Four times a day (QID) | RESPIRATORY_TRACT | 0 refills | Status: DC | PRN

## 2020-06-24 MED ORDER — METHIMAZOLE 5 MG PO TABS: 5.0000 mg | ORAL_TABLET | Freq: Three times a day (TID) | ORAL | 0 refills | Status: AC

## 2020-06-24 MED ORDER — QUETIAPINE FUMARATE 25 MG PO TABS: 25.0000 mg | ORAL_TABLET | Freq: Every day | ORAL | Status: DC

## 2020-06-24 MED ORDER — PREDNISONE 20 MG PO TABS: 40.0000 mg | ORAL_TABLET | Freq: Every day | ORAL | 0 refills | Status: AC

## 2020-06-24 MED ORDER — FLUTICASONE FUROATE-VILANTEROL 100-25 MCG/INH IN AEPB: 1.0000 | INHALATION_SPRAY | Freq: Every day | RESPIRATORY_TRACT | 0 refills | Status: DC

## 2020-06-24 MED ORDER — ALBUTEROL SULFATE (2.5 MG/3ML) 0.083% IN NEBU: 2.5000 mg | INHALATION_SOLUTION | Freq: Four times a day (QID) | RESPIRATORY_TRACT | 0 refills | Status: DC | PRN

## 2020-06-24 NOTE — Discharge Planning (Signed)
IV removed. Discharge papers given, explained and educated. Informed of suggested FU appts and scripts sent to [facility].  Also sent home with Neb machine.  Wheeled to front ED parking lot and driving self home.  No narcotics given this shift beginning 0700.

## 2020-06-24 NOTE — Discharge Summary (Addendum)
Physician Discharge Summary  Derrick Williamson QJF:[id number] DOB: Sep 14, 1970 DOA: 06/21/2020  PCP: Patient, No Pcp Per  Admit date: 06/21/2020 Discharge date: 06/24/2020  Discharge disposition: Home   Recommendations for Outpatient Follow-Up:   Follow-up with PCP in 1 week   Discharge Diagnosis:   Principal Problem:   Acute asthma exacerbation Active Problems:   Acute respiratory failure with hypoxia (HCC)   Acute hypoxemic respiratory failure (HCC)   Tobacco abuse   Hyperthyroidism    Discharge Condition: Stable.  Diet recommendation:  Diet Order            Diet - low sodium heart healthy           Diet heart healthy/carb modified Room service appropriate? Yes; Fluid consistency: Thin  Diet effective now                   Code Status: Full Code     Hospital Course:   Derrick Williamson is a 50 year old man with medical history significant for asthma, tobacco use disorder, alcohol use disorder, who presented to the hospital with shortness of breath, dry cough and wheezing.  He was admitted to the hospital for acute asthma exacerbation and acute hypoxic respiratory failure.  He was treated with oxygen via nasal cannula, IV steroids and bronchodilators.  His symptoms have improved and he has been successfully weaned off of oxygen.  He was able to ambulate in the hallway on room air without oxygen desaturation.  Nebulizer was prescribed and patient was delivered to his room prior to discharge.  He was also found to have hyperthyroidism with TSH<0.10, FT4 2.23 and FT3 7.1.  Chart review showed that TSH was 0.025 and FT4 was greater than 5.5 in August 2016.  It is not clear whether he received any treatment for this.  He was started on methimazole and was strongly advised to follow-up with endocrinologist for further management.  All his symptoms have improved and he is deemed stable for discharge to home today.  He has been advised to avoid alcohol and cigarette  smoking.      Discharge Exam:    Vitals:   06/23/20 1621 06/23/20 1923 06/23/20 2142 06/24/20 0432  BP: 134/81  125/62 121/73  Pulse: 87  100 91  Resp: 18  20 10   Temp: 98.4 F (36.9 C)  98.1 F (36.7 C) 97.7 F (36.5 C)  TempSrc:   Oral Oral  SpO2: 99% 90% 91% 90%  Weight:      Height:         GEN: NAD SKIN: No rash EYES: EOMI ENT: MMM CV: RRR PULM: Mild b/l wheezing ABD: soft, ND, NT, +BS CNS: AAO x 3, non focal EXT: No edema or tenderness   The results of significant diagnostics from this hospitalization (including imaging, microbiology, ancillary and laboratory) are listed below for reference.     Procedures and Diagnostic Studies:   DG Chest Portable 1 View  Result Date: 06/21/2020 CLINICAL DATA:  Dyspnea EXAM: PORTABLE CHEST 1 VIEW COMPARISON:  11/19/2019 FINDINGS: The heart size and mediastinal contours are within normal limits. Both lungs are clear. The visualized skeletal structures are unremarkable. IMPRESSION: No active disease. Electronically Signed   By: 11/21/2019 M.D.   On: 06/21/2020 16:04     Labs:   Basic Metabolic Panel: Recent Labs  Lab 06/21/20 1536 06/22/20 0226 06/23/20 0458  NA 135 133* 136  K 3.7 4.4 4.4  CL 101 103 105  CO2 24 21*  24  GLUCOSE 110* 221* 125*  BUN 14 17 20   CREATININE 0.78 0.87 0.64  CALCIUM 9.0 9.1 8.9  MG  --   --  2.1  PHOS  --   --  4.2   GFR Estimated Creatinine Clearance: 114.5 mL/min (by C-G formula based on SCr of 0.64 mg/dL). Liver Function Tests: No results for input(s): AST, ALT, ALKPHOS, BILITOT, PROT, ALBUMIN in the last 168 hours. No results for input(s): LIPASE, AMYLASE in the last 168 hours. No results for input(s): AMMONIA in the last 168 hours. Coagulation profile No results for input(s): INR, PROTIME in the last 168 hours.  CBC: Recent Labs  Lab 06/21/20 1536 06/22/20 0226 06/22/20 1741  WBC 9.2 6.3 13.1*  HGB 16.5 14.9 15.2  HCT 47.0 41.6 43.8  MCV 86.4 85.2 86.4  PLT 172  170 176   Cardiac Enzymes: No results for input(s): CKTOTAL, CKMB, CKMBINDEX, TROPONINI in the last 168 hours. BNP: Invalid input(s): POCBNP CBG: No results for input(s): GLUCAP in the last 168 hours. D-Dimer Recent Labs    06/21/20 1536  DDIMER 0.29   Hgb A1c Recent Labs    06/22/20 0226  HGBA1C 5.6   Lipid Profile No results for input(s): CHOL, HDL, LDLCALC, TRIG, CHOLHDL, LDLDIRECT in the last 72 hours. Thyroid function studies Recent Labs    06/21/20 1536  TSH <0.010*  T3FREE 7.1*   Anemia work up No results for input(s): VITAMINB12, FOLATE, FERRITIN, TIBC, IRON, RETICCTPCT in the last 72 hours. Microbiology Recent Results (from the past 240 hour(s))  Resp Panel by RT-PCR (Flu A&B, Covid) Nasopharyngeal Swab     Status: None   Collection Time: 06/21/20  5:01 PM   Specimen: Nasopharyngeal Swab; Nasopharyngeal(NP) swabs in vial transport medium  Result Value Ref Range Status   SARS Coronavirus 2 by RT PCR NEGATIVE NEGATIVE Final    Comment: (NOTE) SARS-CoV-2 target nucleic acids are NOT DETECTED.  The SARS-CoV-2 RNA is generally detectable in upper respiratory specimens during the acute phase of infection. The lowest concentration of SARS-CoV-2 viral copies this assay can detect is 138 copies/mL. A negative result does not preclude SARS-Cov-2 infection and should not be used as the sole basis for treatment or other patient management decisions. A negative result may occur with  improper specimen collection/handling, submission of specimen other than nasopharyngeal swab, presence of viral mutation(s) within the areas targeted by this assay, and inadequate number of viral copies(<138 copies/mL). A negative result must be combined with clinical observations, patient history, and epidemiological information. The expected result is Negative.  Fact Sheet for Patients:  08/21/20  Fact Sheet for Healthcare Providers:   BloggerCourse.com  This test is no t yet approved or cleared by the [place]ious[place]ker.[place] FDA and  has been authorized for detection and/or diagnosis of SARS-CoV-2 by FDA under an Emergency Use Authorization (EUA). This EUA will remain  in effect (meaning this test can be used) for the duration of the COVID-19 declaration under Section 564(b)(1) of the Act, 21 U.S.C.section 360bbb-3(b)(1), unless the authorization is terminated  or revoked sooner.       Influenza A by PCR NEGATIVE NEGATIVE Final   Influenza B by PCR NEGATIVE NEGATIVE Final    Comment: (NOTE) The Xpert Xpress SARS-CoV-2/FLU/RSV plus assay is intended as an aid in the diagnosis of influenza from Nasopharyngeal swab specimens and should not be used as a sole basis for treatment. Nasal washings and aspirates are unacceptable for Xpert Xpress SARS-CoV-2/FLU/RSV testing.  Fact Sheet for  Patients: BloggerCourse.com  Fact Sheet for Healthcare Providers: SeriousBroker.it  This test is not yet approved or cleared by the [place] FDA and has been authorized for detection and/or diagnosis of SARS-CoV-2 by FDA under an Emergency Use Authorization (EUA). This EUA will remain in effect (meaning this test can be used) for the duration of the COVID-19 declaration under Section 564(b)(1) of the Act, 21 U.S.C. section 360bbb-3(b)(1), unless the authorization is terminated or revoked.  Performed at [facility], [address], [place], [place] [address]      Discharge Instructions:   Discharge Instructions    Diet - low sodium heart healthy   Complete by: As directed    Discharge instructions   Complete by: As directed    Recommend getting booster dose of Moderna COVID vaccine as an outpatient when you have completely recovered from your acute illness   For home use only DME Nebulizer machine   Complete by: As directed    Patient needs a  nebulizer to treat with the following condition: Acute asthma exacerbation   Length of Need: Lifetime   Increase activity slowly   Complete by: As directed      Allergies as of 06/24/2020   No Known Allergies     Medication List    STOP taking these medications   budesonide-formoterol 160-4.5 MCG/ACT inhaler Commonly known as: SYMBICORT   pantoprazole 20 MG tablet Commonly known as: PROTONIX     TAKE these medications   acetaminophen 500 MG tablet Commonly known as: TYLENOL Take 2 tablets (1,000 mg total) by mouth every 8 (eight) hours as needed for mild pain, moderate pain or headache.   albuterol (2.5 MG/3ML) 0.083% nebulizer solution Commonly known as: PROVENTIL Take 3 mLs (2.5 mg total) by nebulization every 6 (six) hours as needed for wheezing or shortness of breath. What changed: You were already taking a medication with the same name, and this prescription was added. Make sure you understand how and when to take each.   albuterol 108 (90 Base) MCG/ACT inhaler Commonly known as: VENTOLIN HFA Inhale 2 puffs into the lungs every 6 (six) hours as needed for wheezing or shortness of breath. What changed: Another medication with the same name was added. Make sure you understand how and when to take each.   fluticasone furoate-vilanterol 100-25 MCG/INH Aepb Commonly known as: BREO ELLIPTA Inhale 1 puff into the lungs daily.   methimazole 5 MG tablet Commonly known as: TAPAZOLE Take 1 tablet (5 mg total) by mouth 3 (three) times daily.   nicotine 21 mg/24hr patch Commonly known as: NICODERM CQ - dosed in mg/24 hours Place 1 patch (21 mg total) onto the skin daily.   predniSONE 20 MG tablet Commonly known as: DELTASONE Take 2 tablets (40 mg total) by mouth daily for 3 days.            Durable Medical Equipment  (From admission, onward)         Start     Ordered   06/24/20 0000  For home use only DME Nebulizer machine       Question Answer Comment  Patient  needs a nebulizer to treat with the following condition Acute asthma exacerbation   Length of Need Lifetime      06/24/20 1022          Follow-up Information    Solum, Marlana Salvage, MD. Schedule an appointment as soon as possible for a visit in 2 week(s).   Specialty: Endocrinology Contact  information: [address] [facility] [place] [id number] [phone]                Time coordinating discharge 32 minutes  Signed:  Lurene ShadowBERNARD Analia Zuk  [facility] 06/24/2020, 10:22 AM   Pager on [email or web address]. If 7PM-7AM, please contact night-coverage at [email or web address]

## 2020-06-24 NOTE — Plan of Care (Signed)
  Problem: Health Behavior/Discharge Planning: Goal: Ability to manage health-related needs will improve Outcome: Progressing   

## 2020-06-24 NOTE — TOC Transition Note (Signed)
Transition of Care [facility]) - CM/SW Discharge Note   Patient Details  Name: Derrick Williamson MRN: [id number] Date of Birth: May 12, 1970  Transition of Care [facility] CM/SW Contact:  Allayne Butcher, RN Phone Number: 06/24/2020, 11:11 AM   Clinical Narrative:    Patient is medically cleared for discharge home.  Patient needs a nebulizer machine.  Nebulizer machine ordered from [employer] and will be delivered to the room before discharge.  Nebulizer medications have been sent to the patient's pharmacy.     Final next level of care: Home/Self Care Barriers to Discharge: Barriers Resolved   Patient Goals and CMS Choice Patient states their goals for this hospitalization and ongoing recovery are:: still says he is having trouble breathing and feels tight.  Wants to be able to breath better and go back to work      Discharge Placement                       Discharge Plan and Services   Discharge Planning Services: CM Consult            DME Arranged: Nebulizer machine DME Agency: [employer] Date DME Agency Contacted: 06/24/20 Time DME Agency Contacted: 1110 Representative spoke with at DME Agency: Elease Hashimoto [facility] Arranged: NA          Social Determinants of Health (SDOH) Interventions     Readmission Risk Interventions No flowsheet data found.

## 2020-07-01 MED FILL — Medication: Qty: 1 | Status: AC

## 2020-10-07 ENCOUNTER — Inpatient Hospital Stay
Admission: EM | Admit: 2020-10-07 | Discharge: 2020-10-08 | DRG: 202 | Disposition: A | Discharge status: Home / Self Care | Payer: BC Managed Care – PPO | Attending: Internal Medicine | Admitting: Internal Medicine

## 2020-10-07 ENCOUNTER — Emergency Department: Payer: BC Managed Care – PPO

## 2020-10-07 ENCOUNTER — Other Ambulatory Visit: Payer: Self-pay

## 2020-10-07 DIAGNOSIS — E0590 Thyrotoxicosis, unspecified without thyrotoxic crisis or storm: Secondary | ICD-10-CM | POA: Diagnosis present

## 2020-10-07 DIAGNOSIS — J9601 Acute respiratory failure with hypoxia: Secondary | ICD-10-CM | POA: Diagnosis present

## 2020-10-07 DIAGNOSIS — F17210 Nicotine dependence, cigarettes, uncomplicated: Secondary | ICD-10-CM | POA: Diagnosis present

## 2020-10-07 DIAGNOSIS — J45901 Unspecified asthma with (acute) exacerbation: Principal | ICD-10-CM | POA: Diagnosis present

## 2020-10-07 DIAGNOSIS — R0603 Acute respiratory distress: Secondary | ICD-10-CM

## 2020-10-07 DIAGNOSIS — Z833 Family history of diabetes mellitus: Secondary | ICD-10-CM

## 2020-10-07 DIAGNOSIS — Z79899 Other long term (current) drug therapy: Secondary | ICD-10-CM

## 2020-10-07 DIAGNOSIS — Z20822 Contact with and (suspected) exposure to covid-19: Secondary | ICD-10-CM | POA: Diagnosis present

## 2020-10-07 LAB — RESP PANEL BY RT-PCR (FLU A&B, COVID) ARPGX2
Influenza A by PCR: NEGATIVE
Influenza B by PCR: NEGATIVE
SARS Coronavirus 2 by RT PCR: NEGATIVE

## 2020-10-07 LAB — CBC
HCT: 46.9 % (ref 39.0–52.0)
Hemoglobin: 16.5 g/dL (ref 13.0–17.0)
MCH: 31.5 pg (ref 26.0–34.0)
MCHC: 35.2 g/dL (ref 30.0–36.0)
MCV: 89.7 fL (ref 80.0–100.0)
Platelets: 191 10*3/uL (ref 150–400)
RBC: 5.23 MIL/uL (ref 4.22–5.81)
RDW: 11.9 % (ref 11.5–15.5)
WBC: 8.5 10*3/uL (ref 4.0–10.5)
nRBC: 0 % (ref 0.0–0.2)

## 2020-10-07 LAB — TROPONIN I (HIGH SENSITIVITY): Troponin I (High Sensitivity): 6 ng/L (ref <18)

## 2020-10-07 LAB — BASIC METABOLIC PANEL
Anion gap: 5 (ref 5–15)
BUN: 15 mg/dL (ref 6–20)
CO2: 29 mmol/L (ref 22–32)
Calcium: 9.4 mg/dL (ref 8.9–10.3)
Chloride: 103 mmol/L (ref 98–111)
Creatinine, Ser: 0.86 mg/dL (ref 0.61–1.24)
GFR, Estimated: >60 mL/min (ref >60)
Glucose, Bld: 116 mg/dL — ABNORMAL HIGH (ref 70–99)
Potassium: 3.8 mmol/L (ref 3.5–5.1)
Sodium: 137 mmol/L (ref 135–145)

## 2020-10-07 MED ORDER — IPRATROPIUM-ALBUTEROL 0.5-2.5 (3) MG/3ML IN SOLN
3.0000 mL | Freq: Once | RESPIRATORY_TRACT | Status: AC
  Administered 2020-10-07: 3 mL via RESPIRATORY_TRACT

## 2020-10-07 MED ORDER — MAGNESIUM SULFATE 2 GM/50ML IV SOLN
2.0000 g | INTRAVENOUS | Status: AC
  Administered 2020-10-07: 2 g via INTRAVENOUS
  Filled 2020-10-07: qty 50

## 2020-10-07 MED ORDER — METHYLPREDNISOLONE SODIUM SUCC 125 MG IJ SOLR
125.0000 mg | Freq: Once | INTRAMUSCULAR | Status: AC
  Administered 2020-10-07: 125 mg via INTRAVENOUS

## 2020-10-07 MED ORDER — ALBUTEROL SULFATE (2.5 MG/3ML) 0.083% IN NEBU
7.5000 mg | INHALATION_SOLUTION | Freq: Once | RESPIRATORY_TRACT | Status: AC
  Administered 2020-10-08: 7.5 mg via RESPIRATORY_TRACT
  Filled 2020-10-07: qty 9

## 2020-10-07 NOTE — ED Notes (Signed)
RT here with Bipap, pt tolerating bipap

## 2020-10-07 NOTE — ED Triage Notes (Signed)
Pt arrives in resp distress. Pt not able to speak due to distress, little airmovement noted. Pt with history of asthma. Md at bedside

## 2020-10-08 DIAGNOSIS — Z833 Family history of diabetes mellitus: Secondary | ICD-10-CM | POA: Diagnosis not present

## 2020-10-08 DIAGNOSIS — J45901 Unspecified asthma with (acute) exacerbation: Secondary | ICD-10-CM | POA: Diagnosis present

## 2020-10-08 DIAGNOSIS — Z20822 Contact with and (suspected) exposure to covid-19: Secondary | ICD-10-CM | POA: Diagnosis present

## 2020-10-08 DIAGNOSIS — Z79899 Other long term (current) drug therapy: Secondary | ICD-10-CM | POA: Diagnosis not present

## 2020-10-08 DIAGNOSIS — E0590 Thyrotoxicosis, unspecified without thyrotoxic crisis or storm: Secondary | ICD-10-CM | POA: Diagnosis present

## 2020-10-08 DIAGNOSIS — F17210 Nicotine dependence, cigarettes, uncomplicated: Secondary | ICD-10-CM | POA: Diagnosis present

## 2020-10-08 DIAGNOSIS — J4541 Moderate persistent asthma with (acute) exacerbation: Secondary | ICD-10-CM

## 2020-10-08 DIAGNOSIS — J9601 Acute respiratory failure with hypoxia: Secondary | ICD-10-CM | POA: Diagnosis present

## 2020-10-08 DIAGNOSIS — Z720 Tobacco use: Secondary | ICD-10-CM

## 2020-10-08 LAB — CBC
HCT: 41.4 % (ref 39.0–52.0)
Hemoglobin: 14.8 g/dL (ref 13.0–17.0)
MCH: 31.8 pg (ref 26.0–34.0)
MCHC: 35.7 g/dL (ref 30.0–36.0)
MCV: 88.8 fL (ref 80.0–100.0)
Platelets: 160 10*3/uL (ref 150–400)
RBC: 4.66 MIL/uL (ref 4.22–5.81)
RDW: 11.6 % (ref 11.5–15.5)
WBC: 6.5 10*3/uL (ref 4.0–10.5)
nRBC: 0 % (ref 0.0–0.2)

## 2020-10-08 LAB — BASIC METABOLIC PANEL
Anion gap: 7 (ref 5–15)
BUN: 15 mg/dL (ref 6–20)
CO2: 24 mmol/L (ref 22–32)
Calcium: 8.5 mg/dL — ABNORMAL LOW (ref 8.9–10.3)
Chloride: 104 mmol/L (ref 98–111)
Creatinine, Ser: 0.75 mg/dL (ref 0.61–1.24)
GFR, Estimated: >60 mL/min (ref >60)
Glucose, Bld: 180 mg/dL — ABNORMAL HIGH (ref 70–99)
Potassium: 3.9 mmol/L (ref 3.5–5.1)
Sodium: 135 mmol/L (ref 135–145)

## 2020-10-08 LAB — TROPONIN I (HIGH SENSITIVITY): Troponin I (High Sensitivity): 6 ng/L (ref <18)

## 2020-10-08 MED ORDER — IPRATROPIUM-ALBUTEROL 0.5-2.5 (3) MG/3ML IN SOLN: 3.0000 mL | Freq: Four times a day (QID) | RESPIRATORY_TRACT | Status: DC

## 2020-10-08 MED ORDER — SODIUM CHLORIDE 0.9 % IV SOLN: INTRAVENOUS | Status: DC

## 2020-10-08 MED ORDER — BUDESONIDE-FORMOTEROL FUMARATE 80-4.5 MCG/ACT IN AERO: 2.0000 | INHALATION_SPRAY | Freq: Every day | RESPIRATORY_TRACT | 0 refills | Status: AC

## 2020-10-08 MED ORDER — IPRATROPIUM-ALBUTEROL 0.5-2.5 (3) MG/3ML IN SOLN
3.0000 mL | Freq: Four times a day (QID) | RESPIRATORY_TRACT | Status: DC
  Administered 2020-10-08 (×2): 3 mL via RESPIRATORY_TRACT
  Filled 2020-10-08 (×2): qty 3

## 2020-10-08 MED ORDER — ONDANSETRON HCL 4 MG PO TABS: 4.0000 mg | ORAL_TABLET | Freq: Four times a day (QID) | ORAL | Status: DC | PRN

## 2020-10-08 MED ORDER — GUAIFENESIN ER 600 MG PO TB12
600.0000 mg | ORAL_TABLET | Freq: Two times a day (BID) | ORAL | Status: DC
  Administered 2020-10-08: 600 mg via ORAL
  Filled 2020-10-08: qty 1

## 2020-10-08 MED ORDER — ALBUTEROL SULFATE HFA 108 (90 BASE) MCG/ACT IN AERS: 2.0000 | INHALATION_SPRAY | Freq: Four times a day (QID) | RESPIRATORY_TRACT | 0 refills | Status: AC | PRN

## 2020-10-08 MED ORDER — GUAIFENESIN ER 600 MG PO TB12
600.0000 mg | ORAL_TABLET | Freq: Two times a day (BID) | ORAL | 0 refills | Status: AC
Start: 2020-10-08 — End: ?

## 2020-10-08 MED ORDER — TRAZODONE HCL 50 MG PO TABS: 25.0000 mg | ORAL_TABLET | Freq: Every evening | ORAL | Status: DC | PRN

## 2020-10-08 MED ORDER — MAGNESIUM HYDROXIDE 400 MG/5ML PO SUSP: 30.0000 mL | Freq: Every day | ORAL | Status: DC | PRN

## 2020-10-08 MED ORDER — ACETAMINOPHEN 650 MG RE SUPP: 650.0000 mg | Freq: Four times a day (QID) | RECTAL | Status: DC | PRN

## 2020-10-08 MED ORDER — ALBUTEROL SULFATE (2.5 MG/3ML) 0.083% IN NEBU: 2.5000 mg | INHALATION_SOLUTION | Freq: Four times a day (QID) | RESPIRATORY_TRACT | 0 refills | Status: DC | PRN

## 2020-10-08 MED ORDER — GUAIFENESIN ER 600 MG PO TB12
600.0000 mg | ORAL_TABLET | Freq: Two times a day (BID) | ORAL | 0 refills | Status: DC
Start: 2020-10-08 — End: 2020-10-08

## 2020-10-08 MED ORDER — BUDESONIDE-FORMOTEROL FUMARATE 80-4.5 MCG/ACT IN AERO: 2.0000 | INHALATION_SPRAY | Freq: Every day | RESPIRATORY_TRACT | 0 refills | Status: DC

## 2020-10-08 MED ORDER — IPRATROPIUM-ALBUTEROL 0.5-2.5 (3) MG/3ML IN SOLN
3.0000 mL | RESPIRATORY_TRACT | Status: DC | PRN
  Administered 2020-10-08: 3 mL via RESPIRATORY_TRACT
  Filled 2020-10-08: qty 3

## 2020-10-08 MED ORDER — ONDANSETRON HCL 4 MG/2ML IJ SOLN: 4.0000 mg | Freq: Four times a day (QID) | INTRAMUSCULAR | Status: DC | PRN

## 2020-10-08 MED ORDER — METHYLPREDNISOLONE SODIUM SUCC 40 MG IJ SOLR
40.0000 mg | Freq: Four times a day (QID) | INTRAMUSCULAR | Status: DC
  Administered 2020-10-08 (×2): 40 mg via INTRAVENOUS
  Filled 2020-10-08 (×2): qty 1

## 2020-10-08 MED ORDER — HYDROCOD POLST-CPM POLST ER 10-8 MG/5ML PO SUER
5.0000 mL | Freq: Two times a day (BID) | ORAL | Status: DC | PRN
  Administered 2020-10-08: 5 mL via ORAL
  Filled 2020-10-08: qty 5

## 2020-10-08 MED ORDER — PREDNISONE 10 MG PO TABS: ORAL_TABLET | ORAL | 0 refills | Status: DC

## 2020-10-08 MED ORDER — NICOTINE 21 MG/24HR TD PT24
21.0000 mg | MEDICATED_PATCH | TRANSDERMAL | Status: DC
  Administered 2020-10-08: 21 mg via TRANSDERMAL
  Filled 2020-10-08: qty 1

## 2020-10-08 MED ORDER — PREDNISONE 10 MG PO TABS: ORAL_TABLET | ORAL | 0 refills | Status: AC

## 2020-10-08 MED ORDER — ALBUTEROL SULFATE HFA 108 (90 BASE) MCG/ACT IN AERS: 2.0000 | INHALATION_SPRAY | Freq: Four times a day (QID) | RESPIRATORY_TRACT | 0 refills | Status: DC | PRN

## 2020-10-08 MED ORDER — SODIUM CHLORIDE 0.9 % IV SOLN
1.0000 g | INTRAVENOUS | Status: DC
  Administered 2020-10-08: 1 g via INTRAVENOUS
  Filled 2020-10-08: qty 10

## 2020-10-08 MED ORDER — PREDNISONE 20 MG PO TABS: 40.0000 mg | ORAL_TABLET | Freq: Every day | ORAL | Status: DC

## 2020-10-08 MED ORDER — ALBUTEROL SULFATE (2.5 MG/3ML) 0.083% IN NEBU: 2.5000 mg | INHALATION_SOLUTION | Freq: Four times a day (QID) | RESPIRATORY_TRACT | 0 refills | Status: AC | PRN

## 2020-10-08 MED ORDER — ACETAMINOPHEN 325 MG PO TABS: 650.0000 mg | ORAL_TABLET | Freq: Four times a day (QID) | ORAL | Status: DC | PRN

## 2020-10-08 MED ORDER — ENOXAPARIN SODIUM 40 MG/0.4ML IJ SOSY
40.0000 mg | PREFILLED_SYRINGE | INTRAMUSCULAR | Status: DC
  Administered 2020-10-08: 40 mg via SUBCUTANEOUS
  Filled 2020-10-08: qty 0.4

## 2020-10-08 MED ORDER — METHIMAZOLE 5 MG PO TABS
5.0000 mg | ORAL_TABLET | Freq: Three times a day (TID) | ORAL | Status: DC
  Filled 2020-10-08 (×4): qty 1

## 2020-10-08 NOTE — ED Provider Notes (Signed)
[facility] Emergency Department Provider Note  ____________________________________________  Time seen: Approximately 12:22 AM  I have reviewed the triage vital signs and the nursing notes.   HISTORY  Chief Complaint Respiratory Distress    HPI Derrick Williamson is a 50 y.o. male who complains of severe shortness of breath which has been worsening for the past 3 days, gradual onset.  He ran out of his albuterol nebulizer solution at home.  Denies fever or cough.  Has chest tightness but no sharp pleuritic pain.  Symptoms are constant and severe.  No alleviating factors.    Past Medical History:  Diagnosis Date   Asthma      Patient Active Problem List   Diagnosis Date Noted   Hyperthyroidism 06/22/2020   Acute hypoxemic respiratory failure (HCC) 06/21/2020   Tobacco abuse 06/21/2020   Asthma exacerbation attacks 11/20/2019   Asthma exacerbation 09/03/2019   Acute respiratory failure with hypoxia (HCC) 09/02/2019   Acute asthma exacerbation 09/02/2019   Left sided chest pain 12/01/2014   Community acquired pneumonia 12/01/2014     No past surgical history on file.   Prior to Admission medications   Medication Sig Start Date End Date Taking? Authorizing Provider  acetaminophen (TYLENOL) 500 MG tablet Take 2 tablets (1,000 mg total) by mouth every 8 (eight) hours as needed for mild pain, moderate pain or headache. 09/04/19   Darlin Priestly, MD  albuterol (PROVENTIL) (2.5 MG/3ML) 0.083% nebulizer solution Take 3 mLs (2.5 mg total) by nebulization every 6 (six) hours as needed for wheezing or shortness of breath. 06/24/20   Lurene Shadow, MD  albuterol (VENTOLIN HFA) 108 (90 Base) MCG/ACT inhaler Inhale 2 puffs into the lungs every 6 (six) hours as needed for wheezing or shortness of breath. 06/24/20   Lurene Shadow, MD  fluticasone furoate-vilanterol (BREO ELLIPTA) 100-25 MCG/INH AEPB Inhale 1 puff into the lungs daily. 06/24/20   Lurene Shadow, MD  methimazole  (TAPAZOLE) 5 MG tablet Take 1 tablet (5 mg total) by mouth 3 (three) times daily. 06/24/20   Lurene Shadow, MD  nicotine (NICODERM CQ - DOSED IN MG/24 HOURS) 21 mg/24hr patch Place 1 patch (21 mg total) onto the skin daily. 11/21/19 11/20/20  Arnetha Courser, MD     Allergies Patient has no known allergies.   Family History  Problem Relation Age of Onset   Diabetes Mother    Diabetes Father    Diabetes gravidarum Sister    Heart failure Neg Hx     Social History Social History   Tobacco Use   Smoking status: Every Day    Pack years: 0.00   Smokeless tobacco: Never   Tobacco comments:    pt states only smoke little   Vaping Use   Vaping Use: Never used  Substance Use Topics   Alcohol use: Yes    Alcohol/week: 6.0 standard drinks    Types: 6 Cans of beer per week    Comment: Patient stated he drinks about 20 beer bottles a week   Drug use: No    Review of Systems  Constitutional:   No fever or chills.  ENT:   No sore throat. No rhinorrhea. Cardiovascular:   No chest pain or syncope. Respiratory:   Positive shortness of breath without cough. Gastrointestinal:   Negative for abdominal pain, vomiting and diarrhea.  Musculoskeletal:   Negative for focal pain or swelling All other systems reviewed and are negative except as documented above in ROS and HPI.  ____________________________________________  PHYSICAL EXAM:  VITAL SIGNS: ED Triage Vitals  Enc Vitals Group     BP 10/07/20 2247 (!) 162/132     Pulse Rate 10/07/20 2247 (!) 102     Resp 10/07/20 2247 (!) 44     Temp --      Temp Source 10/07/20 2247 Axillary     SpO2 10/07/20 2247 (!) 88 %     Weight 10/07/20 2249 180 lb (81.6 kg)     Height 10/07/20 2249 5\' 7"  (1.702 m)     Head Circumference --      Peak Flow --      Pain Score --      Pain Loc --      Pain Edu? --      Excl. in GC? --     Vital signs reviewed, nursing assessments reviewed.   Constitutional:   Alert and oriented.  Respiratory  distress Eyes:   Conjunctivae are normal. EOMI. PERRL. ENT      Head:   Normocephalic and atraumatic.      Nose:   Wearing a mask.      Mouth/Throat:   Wearing a mask.      Neck:   No meningismus. Full ROM. Hematological/Lymphatic/Immunilogical:   No cervical lymphadenopathy. Cardiovascular:   RRR. Symmetric bilateral radial and DP pulses.  No murmurs. Cap refill less than 2 seconds. Respiratory:   Tachypnea with a respiratory rate of 40, accessory muscle use with belly breathing and intercostal retractions.  Poor air movement in all fields.  Diffuse expiratory wheezing and prolonged expiratory phase Gastrointestinal:   Soft and nontender. Non distended. There is no CVA tenderness.  No rebound, rigidity, or guarding. Genitourinary:   deferred Musculoskeletal:   Normal range of motion in all extremities. No joint effusions.  No lower extremity tenderness.  No edema. Neurologic:   Normal speech and language.  Motor grossly intact. No acute focal neurologic deficits are appreciated.  Skin:    Skin is warm, dry and intact. No rash noted.  No petechiae, purpura, or bullae.  ____________________________________________    LABS (pertinent positives/negatives) (all labs ordered are listed, but only abnormal results are displayed) Labs Reviewed  BASIC METABOLIC PANEL - Abnormal; Notable for the following components:      Result Value   Glucose, Bld 116 (*)    All other components within normal limits  RESP PANEL BY RT-PCR (FLU A&B, COVID) ARPGX2  CBC  TROPONIN I (HIGH SENSITIVITY)  TROPONIN I (HIGH SENSITIVITY)   ____________________________________________   EKG  Interpreted by me  Date: 10/08/2020  Rate: 69  Rhythm: normal sinus rhythm  QRS Axis: normal  Intervals: normal  ST/T Wave abnormalities: normal  Conduction Disutrbances: none  Narrative Interpretation: unremarkable     ____________________________________________    RADIOLOGY  DG Chest Portable 1  View  Result Date: 10/07/2020 CLINICAL DATA:  Shortness of breath. Respiratory distress. History of asthma. EXAM: PORTABLE CHEST 1 VIEW COMPARISON:  Most recent radiograph 06/21/2020 FINDINGS: Lungs are hyperinflated. Increased bronchial thickening from prior exam. No focal airspace disease. Normal heart size and mediastinal contours. No pneumothorax or pneumomediastinum. No pleural fluid. Overlying support apparatus partially obscure right apex evaluation. No acute osseous abnormalities are seen IMPRESSION: Hyperinflation and increased bronchial thickening from prior exam, can be seen with asthma or bronchitis. Electronically Signed   By: 08/21/2020 M.D.   On: 10/07/2020 23:14    ____________________________________________   PROCEDURES .Critical Care  Date/Time: 10/08/2020 12:25 AM Performed by: 10/10/2020,  MD Authorized by: Sharman Cheek, MD   Critical care provider statement:    Critical care time (minutes):  35   Critical care time was exclusive of:  Separately billable procedures and treating other patients   Critical care was necessary to treat or prevent imminent or life-threatening deterioration of the following conditions:  Respiratory failure   Critical care was time spent personally by me on the following activities:  Development of treatment plan with patient or surrogate, discussions with consultants, evaluation of patient's response to treatment, examination of patient, obtaining history from patient or surrogate, ordering and performing treatments and interventions, ordering and review of laboratory studies, ordering and review of radiographic studies, pulse oximetry, re-evaluation of patient's condition and review of old charts  ____________________________________________  DIFFERENTIAL DIAGNOSIS   Pneumonia, pleural effusion, pneumothorax, pulmonary edema, asthma exacerbation  CLINICAL IMPRESSION / ASSESSMENT AND PLAN / ED COURSE  Medications ordered in  the ED: Medications  albuterol (PROVENTIL) (2.5 MG/3ML) 0.083% nebulizer solution 7.5 mg (has no administration in time range)  ipratropium-albuterol (DUONEB) 0.5-2.5 (3) MG/3ML nebulizer solution 3 mL (3 mLs Nebulization Given 10/07/20 2236)  ipratropium-albuterol (DUONEB) 0.5-2.5 (3) MG/3ML nebulizer solution 3 mL (3 mLs Nebulization Given 10/07/20 2236)  ipratropium-albuterol (DUONEB) 0.5-2.5 (3) MG/3ML nebulizer solution 3 mL (3 mLs Nebulization Given 10/07/20 2236)  methylPREDNISolone sodium succinate (SOLU-MEDROL) 125 mg/2 mL injection 125 mg (125 mg Intravenous Given 10/07/20 2247)  magnesium sulfate IVPB 2 g 50 mL (2 g Intravenous New Bag/Given 10/07/20 2300)    Pertinent labs & imaging results that were available during my care of the patient were reviewed by me and considered in my medical decision making (see chart for details).  Derrick Williamson was evaluated in Emergency Department on 10/08/2020 for the symptoms described in the history of present illness. He was evaluated in the context of the global COVID-19 pandemic, which necessitated consideration that the patient might be at risk for infection with the SARS-CoV-2 virus that causes COVID-19. Institutional protocols and algorithms that pertain to the evaluation of patients at risk for COVID-19 are in a state of rapid change based on information released by regulatory bodies including the CDC and federal and state organizations. These policies and algorithms were followed during the patient's care in the ED.   Patient presents in respiratory distress, most likely asthma exacerbation.  Chest x-ray unremarkable, labs unremarkable, EKG normal.  Patient placed on BiPAP immediately upon arrival while giving duo nebs, magnesium, Solu-Medrol.  On reassessment, patient is feeling better, able to be taken off BiPAP and maintaining oxygen saturation of about 93% on room air.  However, still very wheezy with accessory muscle use and prolonged expiratory  phase.  Will give additional bronchodilators and admit for further management.      ____________________________________________   FINAL CLINICAL IMPRESSION(S) / ED DIAGNOSES    Final diagnoses:  Respiratory distress  Severe asthma with exacerbation, unspecified whether persistent  Acute respiratory failure with hypoxia [facility])     ED Discharge Orders     None       Portions of this note were generated with dragon dictation software. Dictation errors may occur despite best attempts at proofreading.   Sharman Cheek, MD 10/08/20 [phone]

## 2020-10-08 NOTE — ED Notes (Signed)
Pt given meal tray and apply juice

## 2020-10-08 NOTE — ED Notes (Signed)
Patient denies pain and is resting comfortably.  

## 2020-10-08 NOTE — ED Notes (Addendum)
Pt updated on POC.

## 2020-10-08 NOTE — TOC Initial Note (Signed)
Transition of Care [facility]) - Initial/Assessment Note    Patient Details  Name: Derrick Williamson MRN: [id number] Date of Birth: 05-10-1970  [facility] CM/SW Contact:    Elliot Gurney [place], [place] Phone Number:[phone] 10/08/2020, 1:54 PM  Clinical Narrative:                 Patient admitted to the hospital with asthma exacerbation.  TOC received consult for medication assistance.  Social Worker met with patient at the bedside.  Patient reports that he is lives with his girlfriend, he drives himself and works full time as a Programmer, systems  Patient reports difficulty paying for his medications, specifically the albuterol. This Education officer, museum provided patient with a GoodRx prescription savings card to assist with cost.  Lowest cost can be found at [employer]. Prescription requested to be sent there. Patient verbalized having no additional needs at this time.   TOC to continue to follow as needed.   Janeese Mcgloin, LCSW Transition of Care [phone]     Expected Discharge Plan: Home/Self Care Barriers to Discharge: No Barriers Identified   Patient Goals and CMS Choice Patient states their goals for this hospitalization and ongoing recovery are:: "To get my medications"      Expected Discharge Plan and Services Expected Discharge Plan: Home/Self Care In-house Referral: Clinical Social Work     Living arrangements for the past 2 months: Single Family Home Expected Discharge Date: 10/08/20                                    Prior Living Arrangements/Services Living arrangements for the past 2 months: Single Family Home Lives with:: Domestic Partner Patient language and need for interpreter reviewed:: Yes Do you feel safe going back to the place where you live?: Yes      Need for Family Participation in Patient Care: No (Comment) Care giver support system in place?: Yes (comment) Current home services: DME Criminal Activity/Legal Involvement  Pertinent to Current Situation/Hospitalization: No - Comment as needed  Activities of Daily Living      Permission Sought/Granted                  Emotional Assessment Appearance:: Appears stated age Attitude/Demeanor/Rapport: Gracious Affect (typically observed): Accepting, Adaptable Orientation: : Oriented to Self, Oriented to Place, Oriented to  Time, Oriented to Situation Alcohol / Substance Use: Not Applicable Psych Involvement: No (comment)  Admission diagnosis:  Acute asthma exacerbation [J45.901] Patient Active Problem List   Diagnosis Date Noted   Hyperthyroidism 06/22/2020   Acute hypoxemic respiratory failure ([place]) 06/21/2020   Tobacco abuse 06/21/2020   Asthma exacerbation attacks 11/20/2019   Asthma exacerbation 09/03/2019   Acute respiratory failure with hypoxia ([place]) 09/02/2019   Acute asthma exacerbation 09/02/2019   Left sided chest pain 12/01/2014   Community acquired pneumonia 12/01/2014   PCP:  Patient, No Pcp Per (Inactive) Pharmacy:   [id number] [place] [place]: [phone]: [address] [place] [place]Phone: [phone]: [phone]    Social Determinants of Health (SDOH) Interventions    Readmission Risk Interventions No flowsheet data found.

## 2020-10-08 NOTE — ED Notes (Signed)
Pt eating lunch, sw at bedside

## 2020-10-08 NOTE — H&P (Signed)
[facility]   PATIENT NAME: Derrick Williamson    MR#:  [id number]  DATE OF BIRTH:   30, 1972  DATE OF ADMISSION:  10/07/2020  PRIMARY CARE PHYSICIAN: Patient, No Pcp Per (Inactive)   Patient is coming from: Home  REQUESTING/REFERRING PHYSICIAN: Alfonse Flavors, MD  CHIEF COMPLAINT:   Chief Complaint  Patient presents with  . Respiratory Distress    HISTORY OF PRESENT ILLNESS:  Derrick Williamson is a 50 y.o. Hispanic American male with medical history significant for asthma and hyperthyroidism who presented to [facility] worsening dyspnea with associated dry cough and wheezing for the last day and subsequent severe respiratory distress upon arrival to the ER requiring BiPAP and pulse oximetry was 88 % on room air.  The patient denies any fever or chills.  No chest pain or palpitations.  No nausea or vomiting or abdominal pain.  No dysuria, oliguria or hematuria or flank pain.  He smokes about half pack cigarettes per day.  ED Course: When he came to the ER blood pressure was elevated 162/132 with a heart rate of 102 and respiratory to 44 and pulse symmetry of 88% on room air.  He was initially placed on BiPAP that was tapered off.  Respiratory rate later improved to 60 and blood pressure at 103/53.  Labs revealed unremarkable BMP and CBC.  High-sensitivity troponin was 6 twice.  Influenza antigens and COVID-19 PCR came back negative.   Imaging: Portable chest x-ray showed no acute cardiopulmonary disease. It did show hyperinflation and increased bronchial thickening from prior exam.  This can be seen with asthma or bronchitis.  The patient was given duo nebs x3, 2 g of IV magnesium sulfate 125 mg IV Solu-Medrol.  He will be admitted to a medical monitored bed for further evaluation and management. PAST MEDICAL HISTORY:   Past Medical History:  Diagnosis Date  . Asthma   Hyperthyroidism  PAST SURGICAL HISTORY:  No past surgical history on file.  SOCIAL HISTORY:    Social History   Tobacco Use  . Smoking status: Every Day    Pack years: 0.00  . Smokeless tobacco: Never  . Tobacco comments:    pt states only smoke little   Substance Use Topics  . Alcohol use: Yes    Alcohol/week: 6.0 standard drinks    Types: 6 Cans of beer per week    Comment: Patient stated he drinks about 20 beer bottles a week    FAMILY HISTORY:   Family History  Problem Relation Age of Onset  . Diabetes Mother   . Diabetes Father   . Diabetes gravidarum Sister   . Heart failure Neg Hx     DRUG ALLERGIES:  No Known Allergies  REVIEW OF SYSTEMS:   ROS As per history of present illness. All pertinent systems were reviewed above. Constitutional, HEENT, cardiovascular, respiratory, GI, GU, musculoskeletal, neuro, psychiatric, endocrine, integumentary and hematologic systems were reviewed and are otherwise negative/unremarkable except for positive findings mentioned above in the HPI.   MEDICATIONS AT HOME:   Prior to Admission medications   Medication Sig Start Date End Date Taking? Authorizing Provider  acetaminophen (TYLENOL) 500 MG tablet Take 2 tablets (1,000 mg total) by mouth every 8 (eight) hours as needed for mild pain, moderate pain or headache. 09/04/19   Darlin Priestly, MD  albuterol (PROVENTIL) (2.5 MG/3ML) 0.083% nebulizer solution Take 3 mLs (2.5 mg total) by nebulization every 6 (six) hours as needed for wheezing or shortness of  breath. 06/24/20   Lurene Shadow, MD  albuterol (VENTOLIN HFA) 108 (90 Base) MCG/ACT inhaler Inhale 2 puffs into the lungs every 6 (six) hours as needed for wheezing or shortness of breath. 06/24/20   Lurene Shadow, MD  fluticasone furoate-vilanterol (BREO ELLIPTA) 100-25 MCG/INH AEPB Inhale 1 puff into the lungs daily. 06/24/20   Lurene Shadow, MD  methimazole (TAPAZOLE) 5 MG tablet Take 1 tablet (5 mg total) by mouth 3 (three) times daily. 06/24/20   Lurene Shadow, MD  nicotine (NICODERM CQ - DOSED IN MG/24 HOURS) 21 mg/24hr patch  Place 1 patch (21 mg total) onto the skin daily. 11/21/19 11/20/20  Arnetha Courser, MD      VITAL SIGNS:  Blood pressure (!) 161/104, pulse 80, resp. rate (!) 27, height 5\' 7"  (1.702 m), weight 81.6 kg, SpO2 94 %.  PHYSICAL EXAMINATION:  Physical Exam  GENERAL:  50 y.o.-year-old Hispanic American male patient lying in the bed with mild respiratory distress with conversational dyspnea. EYES: Pupils equal, round, reactive to light and accommodation. No scleral icterus. Extraocular muscles intact.  HEENT: Head atraumatic, normocephalic. Oropharynx and nasopharynx clear.  NECK:  Supple, no jugular venous distention. No thyroid enlargement, no tenderness.  LUNGS: Diffuse expiratory wheezes with tight expiratory airflow and heart vesicular breathing.  CARDIOVASCULAR: Regular rate and rhythm, S1, S2 normal. No murmurs, rubs, or gallops.  ABDOMEN: Soft, nondistended, nontender. Bowel sounds present. No organomegaly or mass.  EXTREMITIES: No pedal edema, cyanosis, or clubbing.  NEUROLOGIC: Cranial nerves II through XII are intact. Muscle strength 5/5 in all extremities. Sensation intact. Gait not checked.  PSYCHIATRIC: The patient is alert and oriented x 3.  Normal affect and good eye contact. SKIN: No obvious rash, lesion, or ulcer.   LABORATORY PANEL:   CBC Recent Labs  Lab 10/07/20 2247  WBC 8.5  HGB 16.5  HCT 46.9  PLT 191   ------------------------------------------------------------------------------------------------------------------  Chemistries  Recent Labs  Lab 10/07/20 2247  NA 137  K 3.8  CL 103  CO2 29  GLUCOSE 116*  BUN 15  CREATININE 0.86  CALCIUM 9.4   ------------------------------------------------------------------------------------------------------------------  Cardiac Enzymes No results for input(s): TROPONINI in the last 168  hours. ------------------------------------------------------------------------------------------------------------------  RADIOLOGY:  DG Chest Portable 1 View  Result Date: 10/07/2020 CLINICAL DATA:  Shortness of breath. Respiratory distress. History of asthma. EXAM: PORTABLE CHEST 1 VIEW COMPARISON:  Most recent radiograph 06/21/2020 FINDINGS: Lungs are hyperinflated. Increased bronchial thickening from prior exam. No focal airspace disease. Normal heart size and mediastinal contours. No pneumothorax or pneumomediastinum. No pleural fluid. Overlying support apparatus partially obscure right apex evaluation. No acute osseous abnormalities are seen IMPRESSION: Hyperinflation and increased bronchial thickening from prior exam, can be seen with asthma or bronchitis. Electronically Signed   By: 08/21/2020 M.D.   On: 10/07/2020 23:14      IMPRESSION AND PLAN:  Active Problems:   Acute asthma exacerbation  1.  Acute severe asthma exacerbation possibly secondary to acute bronchitis, with subsequent acute hypoxic respiratory failure. - The patient will be admitted to a medical monitored bed. - We will continue IV steroid therapy with IV Solu-Medrol. - Given severity of his exacerbation and the likelihood of acute bronchitis on chest x-ray and his chronic tobacco abuse, will go ahead and start him on IV Rocephin. - O2 protocol will be followed. - We will hold off his Breo  2.  Hyperthyroidism. - We will continue his Tapazole.  3.  Ongoing tobacco abuse. - He was counseled for smoking  cessation he will receive further counseling here.  DVT prophylaxis: Lovenox. Code Status: full code. Family Communication:  The plan of care was discussed in details with the patient (and family). I answered all questions. The patient agreed to proceed with the above mentioned plan. Further management will depend upon hospital course. Disposition Plan: Back to previous home environment Consults called:  none. All the records are reviewed and case discussed with ED provider.  Status is: Inpatient  Remains inpatient appropriate because:Hemodynamically unstable, Ongoing diagnostic testing needed not appropriate for outpatient work up, Unsafe d/c plan, IV treatments appropriate due to intensity of illness or inability to take PO, and Inpatient level of care appropriate due to severity of illness  Dispo: The patient is from: Home              Anticipated d/c is to: Home              Patient currently is not medically stable to d/c.   Difficult to place patient No   TOTAL TIME TAKING CARE OF THIS PATIENT: 55 minutes.    Hannah Beat M.D on 10/08/2020 at 12:23 AM  [facility]   From 7 PM-7 AM, contact night-coverage [email or web address]  CC: Primary care physician; Patient, No Pcp Per (Inactive)

## 2021-09-17 IMAGING — CR DG CHEST 2V
2 series · 2 of 2 positions shown · non-contrast
Comparison: 12/01/2014

CLINICAL DATA: Short of breath

EXAM:
CHEST - 2 VIEW

[chest pa]
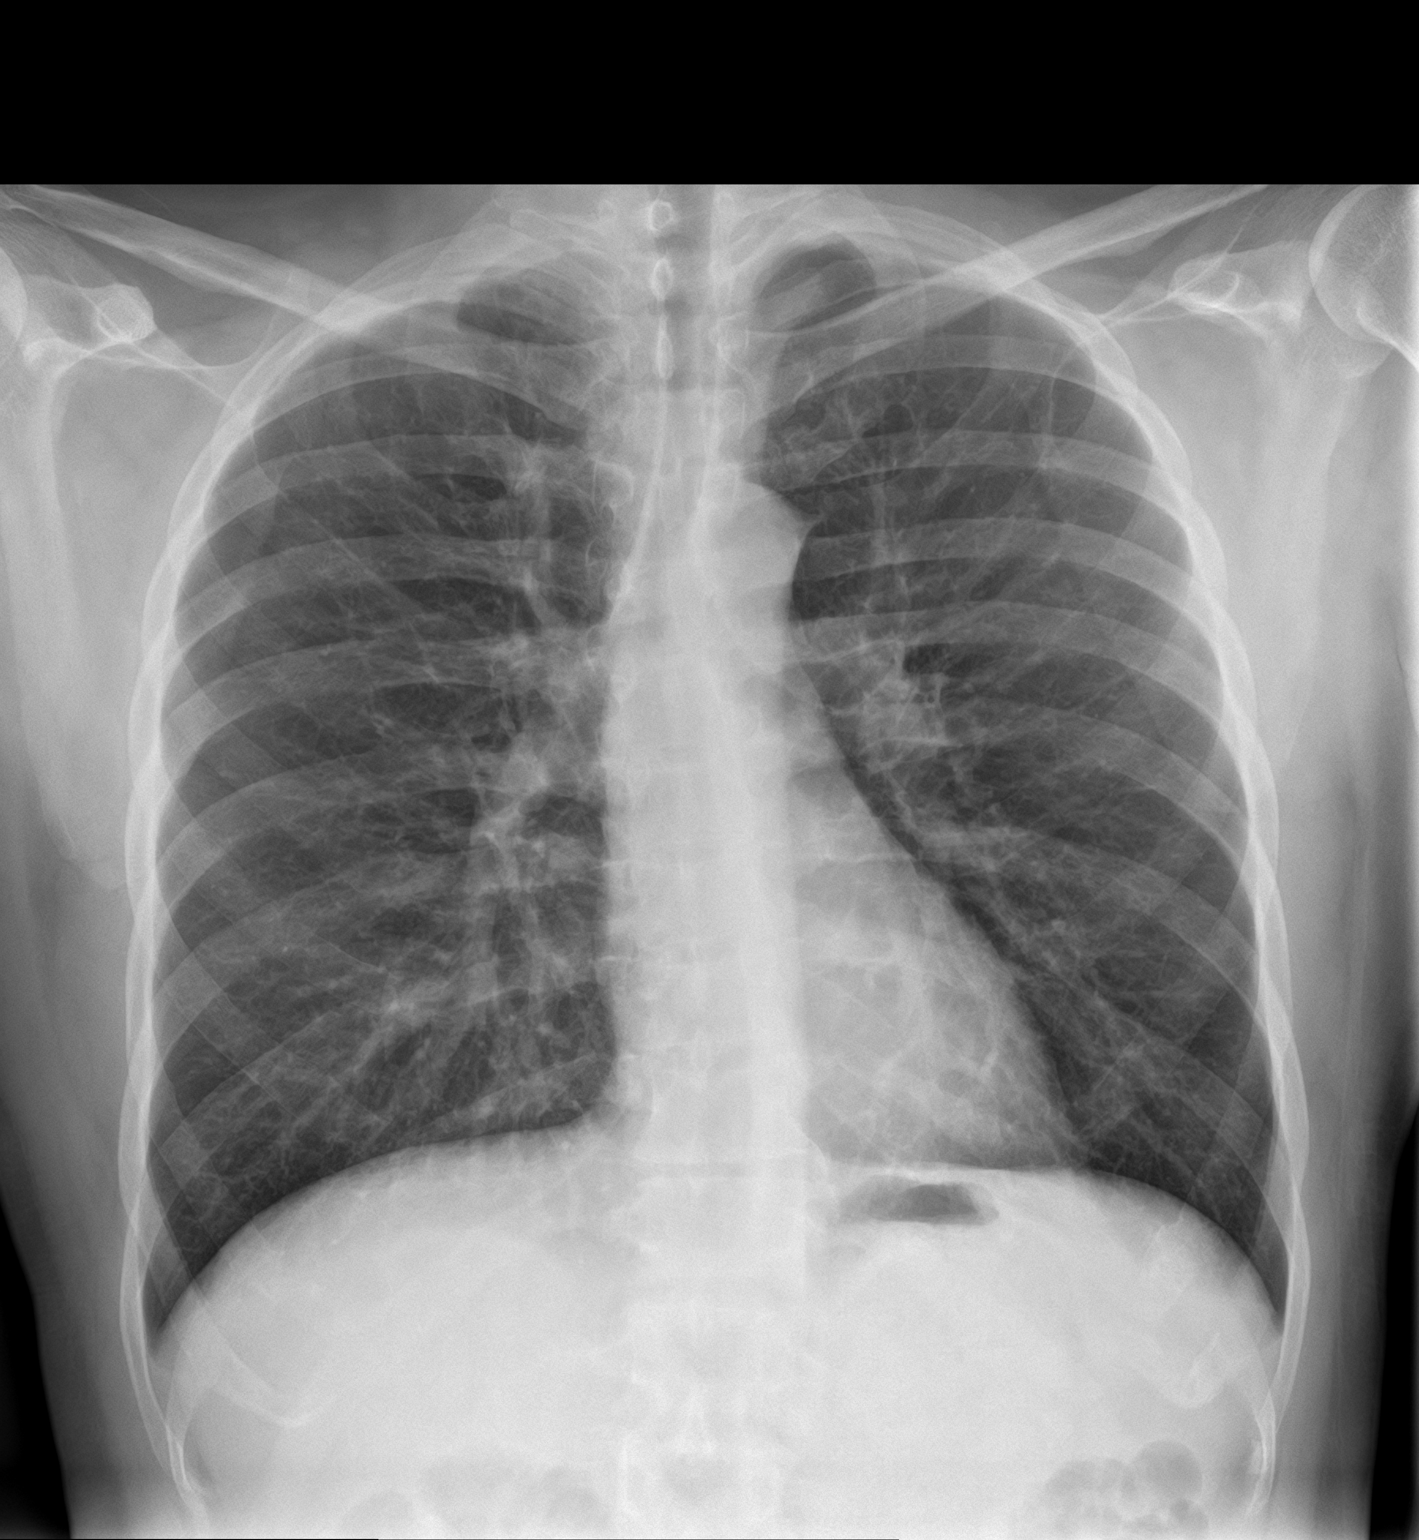

[chest lat]
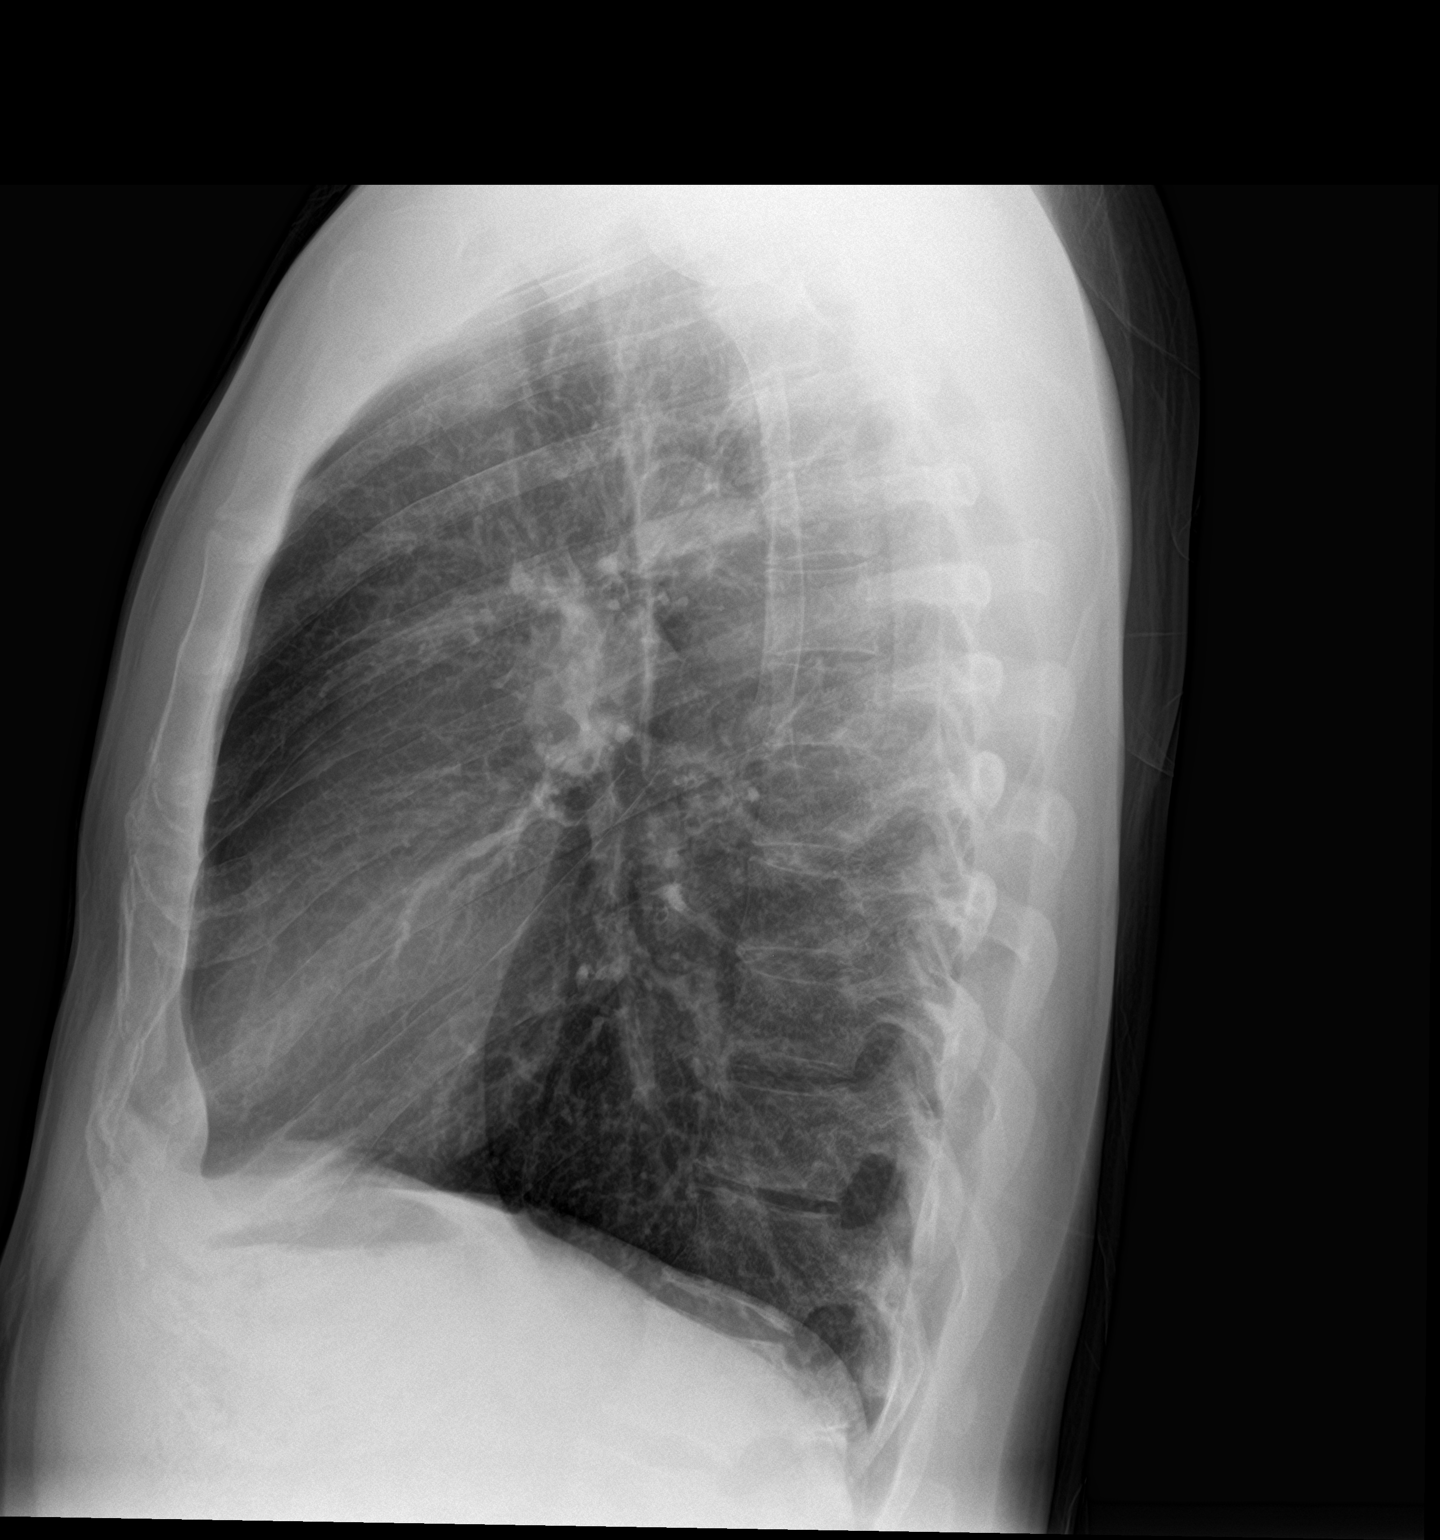

[2 of 2 positions shown; findings below may reference images not displayed]

FINDINGS: The heart size and mediastinal contours are within normal limits.
Both lungs are clear. The visualized skeletal structures are
unremarkable.
IMPRESSION: No active cardiopulmonary disease.

## 2021-10-13 IMAGING — CR DG CHEST 2V
3 series · 3 of 3 positions shown · non-contrast
Comparison: July 24, 2019

CLINICAL DATA: Wheezing.

EXAM:
CHEST - 2 VIEW

[chest pa]
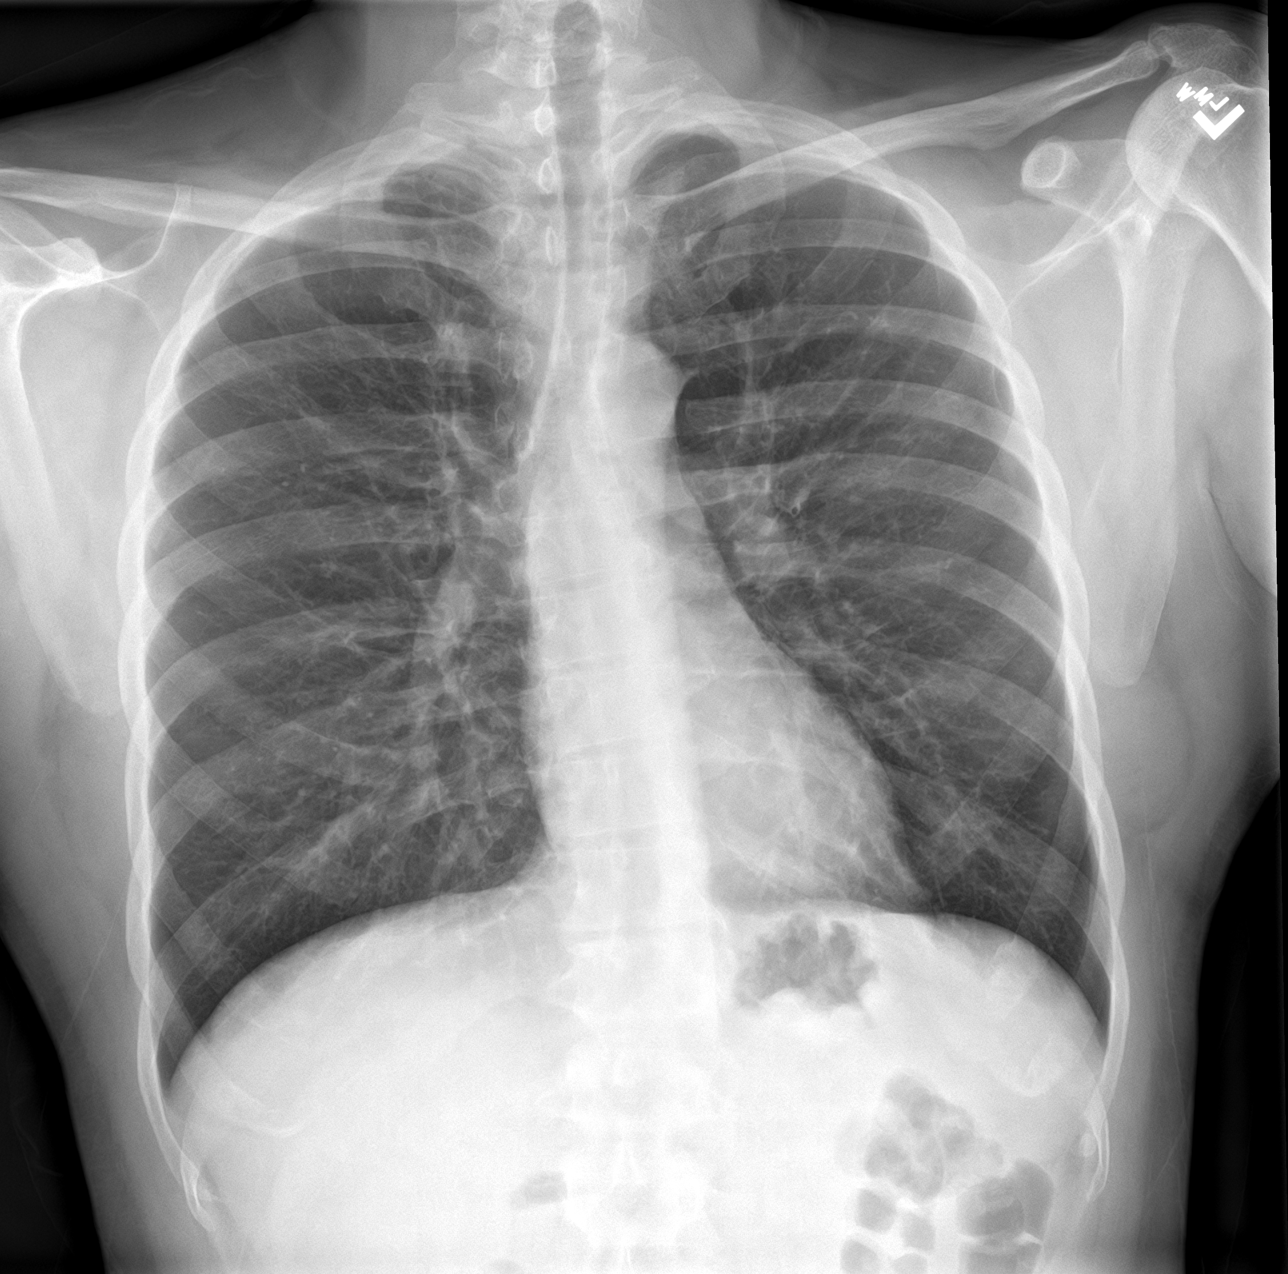

[chest lat (1 of 2)]
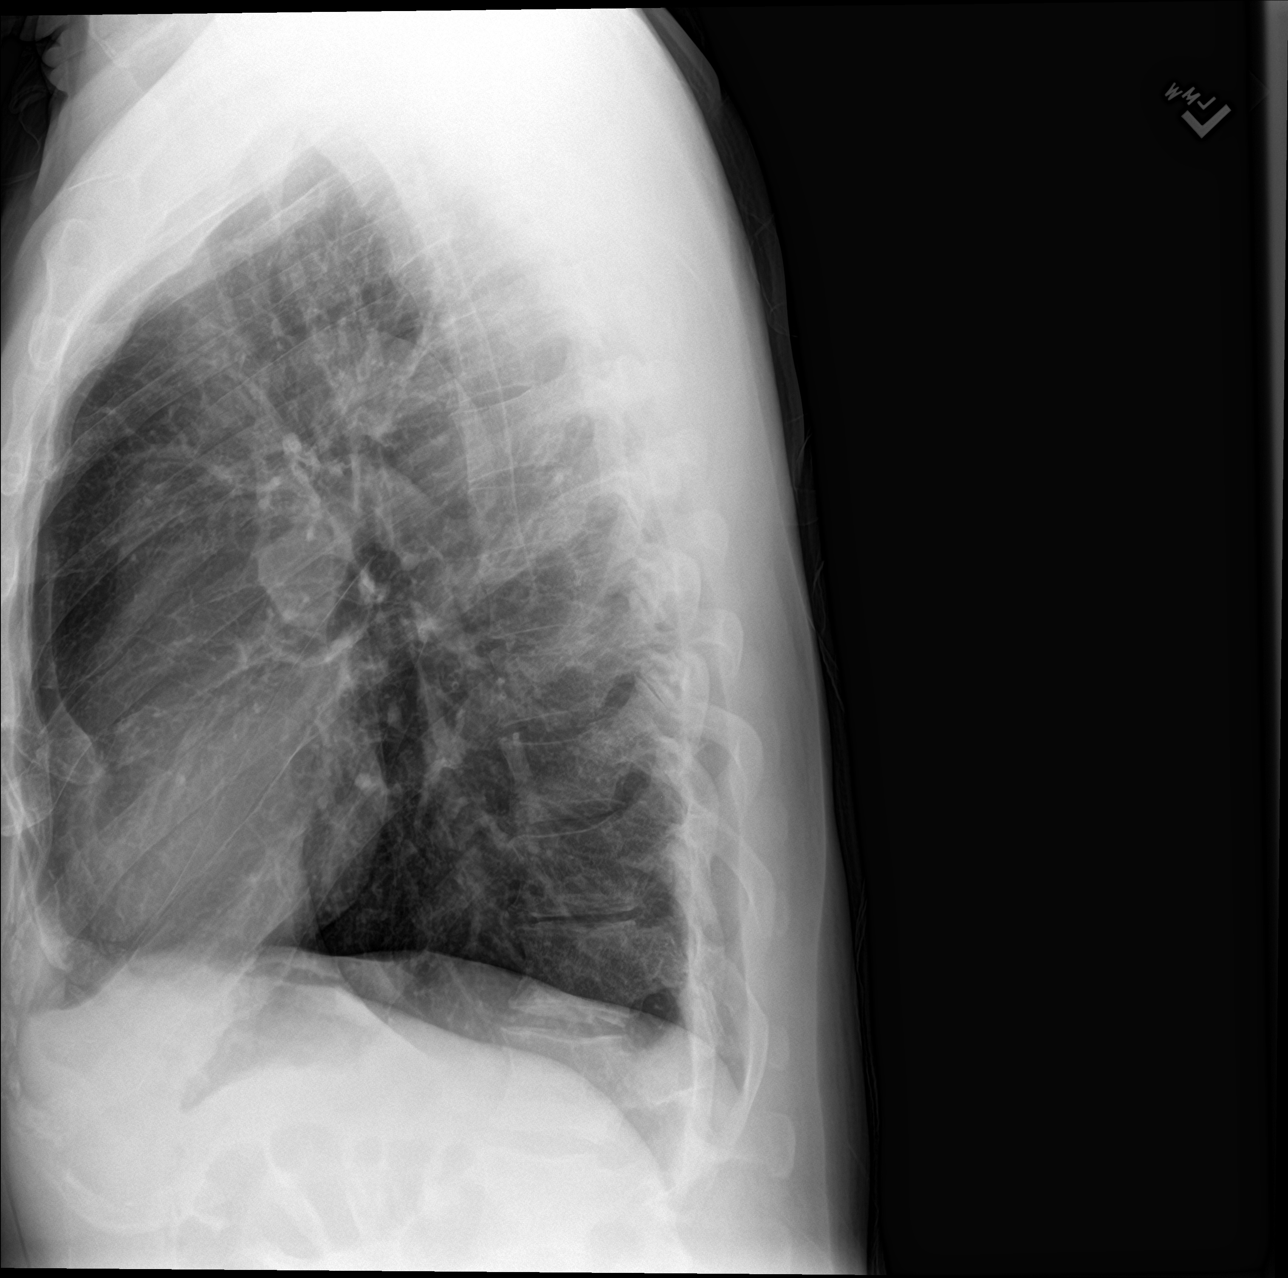

[chest lat (2 of 2)]
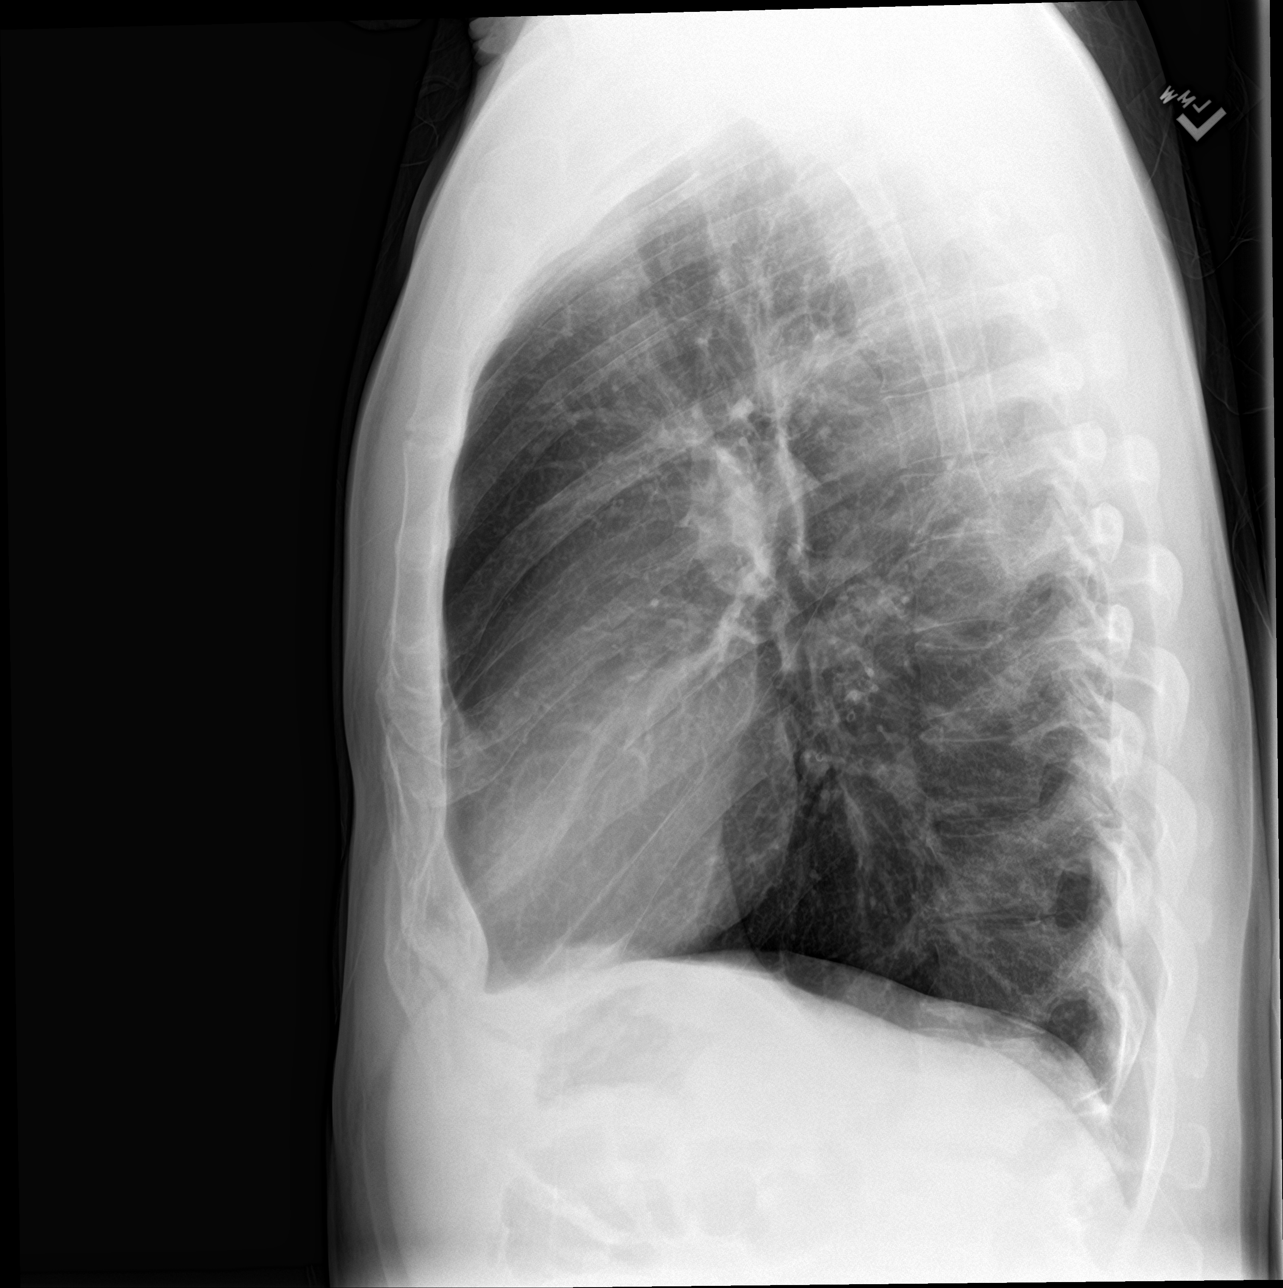

[3 of 3 positions shown; findings below may reference images not displayed]

FINDINGS: The heart size and mediastinal contours are within normal limits.
Both lungs are clear. The visualized skeletal structures are
unremarkable.
IMPRESSION: No active cardiopulmonary disease.
